# Patient Record
Sex: Female | Born: 1975
Health system: Southern US, Community
[De-identification: ages and names within clinical notes are randomized; demographics above are authoritative.]

## PROBLEM LIST (undated history)

## (undated) DIAGNOSIS — Z9889 Other specified postprocedural states: Secondary | ICD-10-CM

## (undated) DIAGNOSIS — Z86718 Personal history of other venous thrombosis and embolism: Secondary | ICD-10-CM

## (undated) DIAGNOSIS — R011 Cardiac murmur, unspecified: Secondary | ICD-10-CM

## (undated) DIAGNOSIS — R112 Nausea with vomiting, unspecified: Secondary | ICD-10-CM

## (undated) HISTORY — DX: Personal history of other venous thrombosis and embolism: Z86.718

## (undated) HISTORY — PX: TYMPANOPLASTY: SHX33

---

## 2003-06-03 ENCOUNTER — Inpatient Hospital Stay (HOSPITAL_COMMUNITY): Admission: AD | Admit: 2003-06-03 | Discharge: 2003-06-03 | Payer: Self-pay | Admitting: Obstetrics and Gynecology

## 2003-06-11 ENCOUNTER — Inpatient Hospital Stay (HOSPITAL_COMMUNITY): Admission: AD | Admit: 2003-06-11 | Discharge: 2003-06-24 | Payer: Self-pay | Admitting: Obstetrics and Gynecology

## 2003-06-26 ENCOUNTER — Inpatient Hospital Stay (HOSPITAL_COMMUNITY): Admission: AD | Admit: 2003-06-26 | Discharge: 2003-06-26 | Payer: Self-pay | Admitting: Obstetrics and Gynecology

## 2003-07-08 ENCOUNTER — Inpatient Hospital Stay (HOSPITAL_COMMUNITY): Admission: RE | Admit: 2003-07-08 | Discharge: 2003-07-08 | Payer: Self-pay | Admitting: Obstetrics and Gynecology

## 2003-07-14 ENCOUNTER — Observation Stay (HOSPITAL_COMMUNITY): Admission: AD | Admit: 2003-07-14 | Discharge: 2003-07-15 | Payer: Self-pay | Admitting: Obstetrics and Gynecology

## 2003-07-30 ENCOUNTER — Inpatient Hospital Stay (HOSPITAL_COMMUNITY): Admission: AD | Admit: 2003-07-30 | Discharge: 2003-07-30 | Payer: Self-pay | Admitting: Obstetrics and Gynecology

## 2003-08-07 ENCOUNTER — Inpatient Hospital Stay (HOSPITAL_COMMUNITY): Admission: RE | Admit: 2003-08-07 | Discharge: 2003-08-07 | Payer: Self-pay | Admitting: Obstetrics and Gynecology

## 2003-08-21 ENCOUNTER — Inpatient Hospital Stay (HOSPITAL_COMMUNITY): Admission: AD | Admit: 2003-08-21 | Discharge: 2003-08-26 | Payer: Self-pay | Admitting: Obstetrics and Gynecology

## 2003-08-22 ENCOUNTER — Encounter (INDEPENDENT_AMBULATORY_CARE_PROVIDER_SITE_OTHER): Payer: Self-pay | Admitting: *Deleted

## 2003-08-27 ENCOUNTER — Encounter: Admission: RE | Admit: 2003-08-27 | Discharge: 2003-09-26 | Payer: Self-pay | Admitting: Obstetrics and Gynecology

## 2003-09-27 ENCOUNTER — Encounter: Admission: RE | Admit: 2003-09-27 | Discharge: 2003-10-27 | Payer: Self-pay | Admitting: Obstetrics and Gynecology

## 2003-10-01 ENCOUNTER — Other Ambulatory Visit: Admission: RE | Admit: 2003-10-01 | Discharge: 2003-10-01 | Payer: Self-pay | Admitting: Obstetrics and Gynecology

## 2004-11-11 ENCOUNTER — Other Ambulatory Visit: Admission: RE | Admit: 2004-11-11 | Discharge: 2004-11-11 | Payer: Self-pay | Admitting: Obstetrics and Gynecology

## 2006-01-31 HISTORY — PX: CHOLECYSTECTOMY: SHX55

## 2006-02-12 IMAGING — US US OB COMP +14 WK
1 series · 17 of 28 positions shown · non-contrast
Comparison: none

CLINICAL DATA: Bleeding.  History of low-lying placenta.  EDC 09/29/03 (23 weeks 1 day).  G1 P0.

[Series 1: us ob comp +14 wk · 17 of 62 slices shown]
[im 1/62]
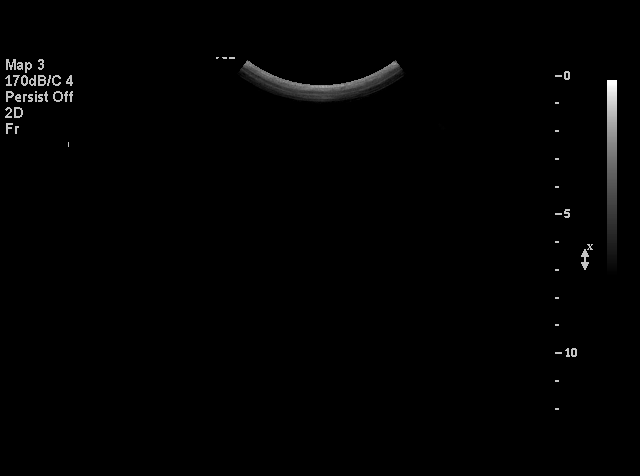
[im 5/62]
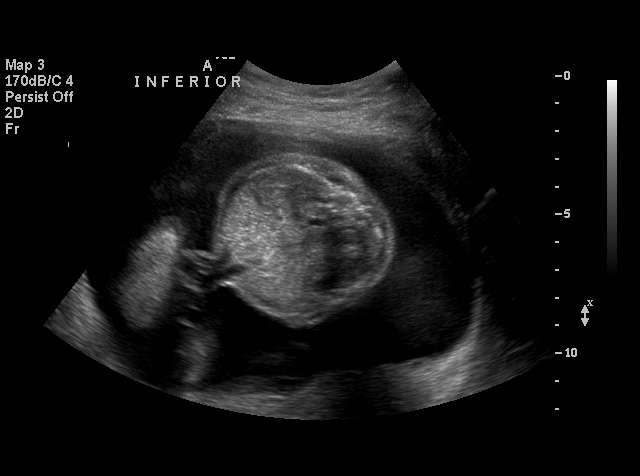
[im 10/62]
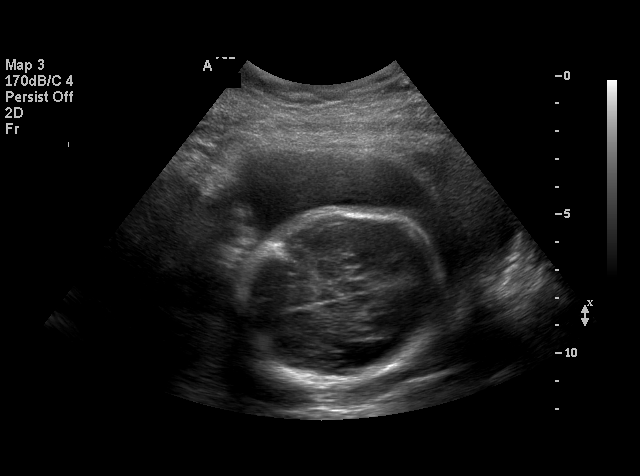
[im 12/62]
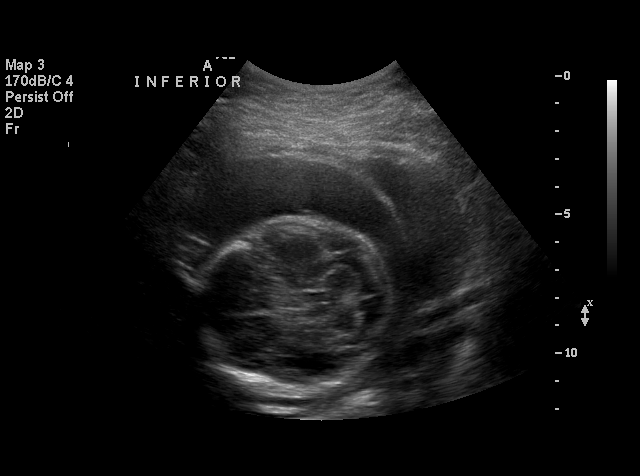
[im 16/62]
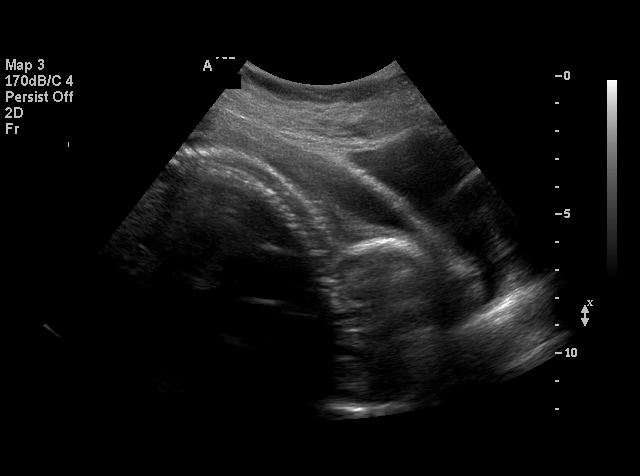
[im 21/62]
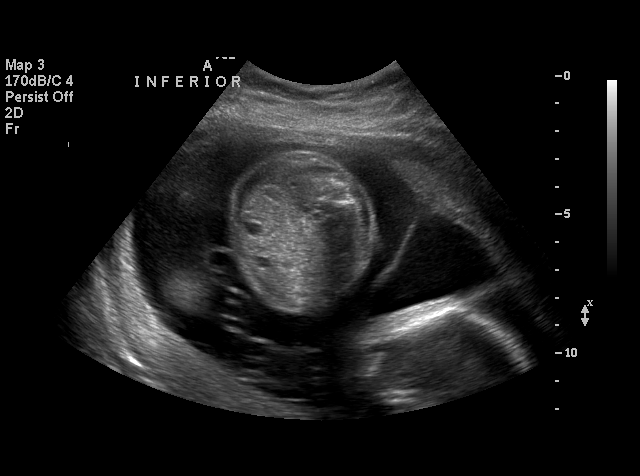
[im 23/62]
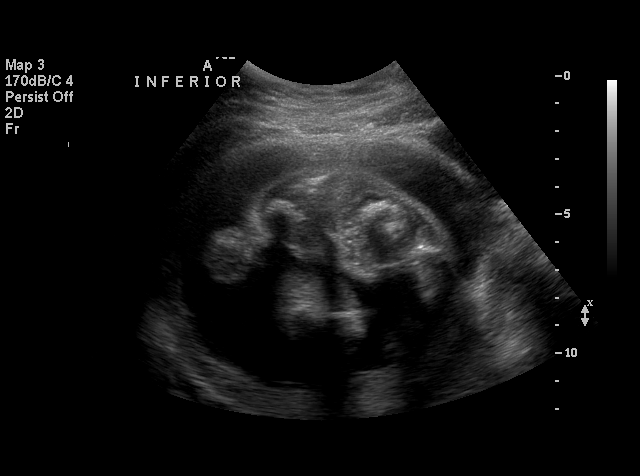
[im 28/62]
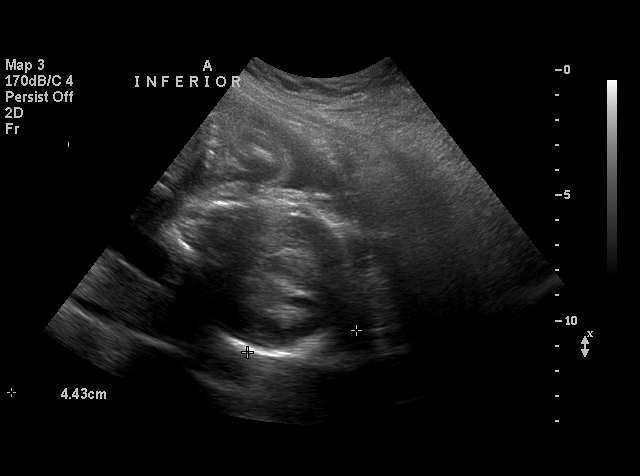
[im 32/62]
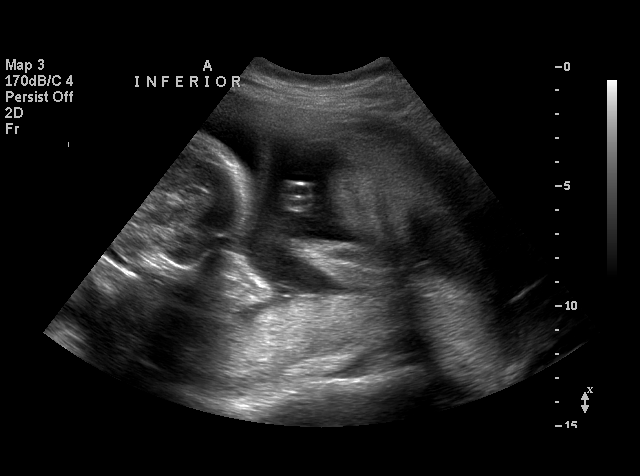
[im 34/62]
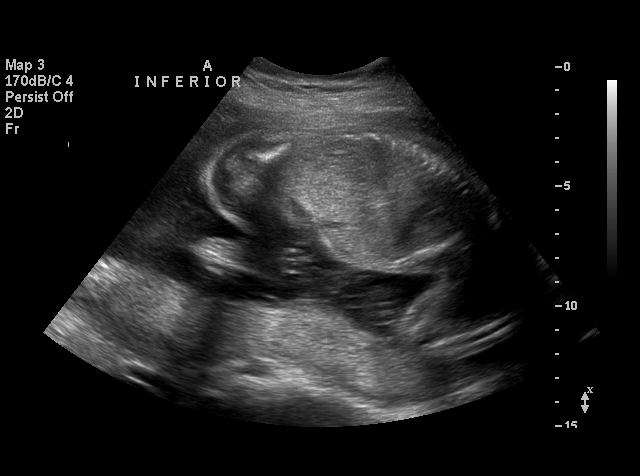
[im 39/62]
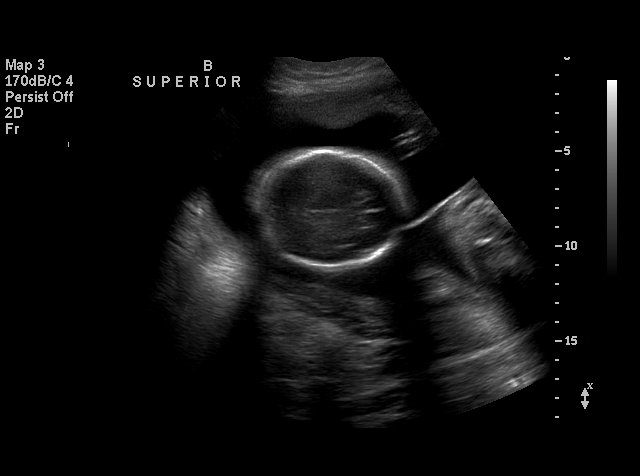
[im 41/62]
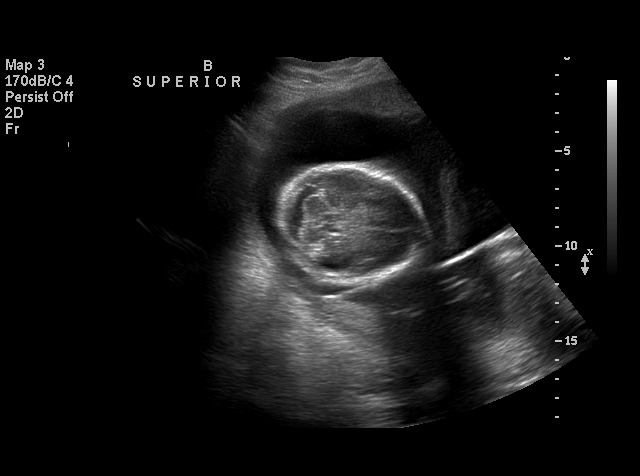
[im 46/62]
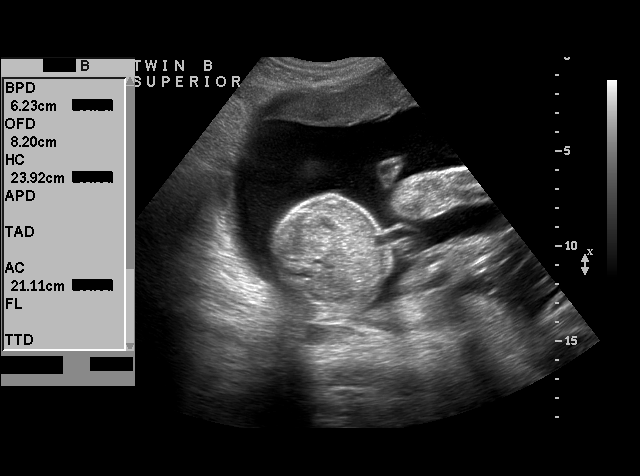
[im 50/62]
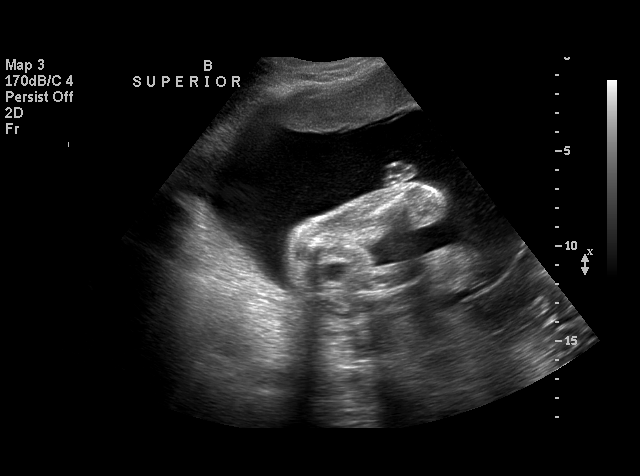
[im 52/62]
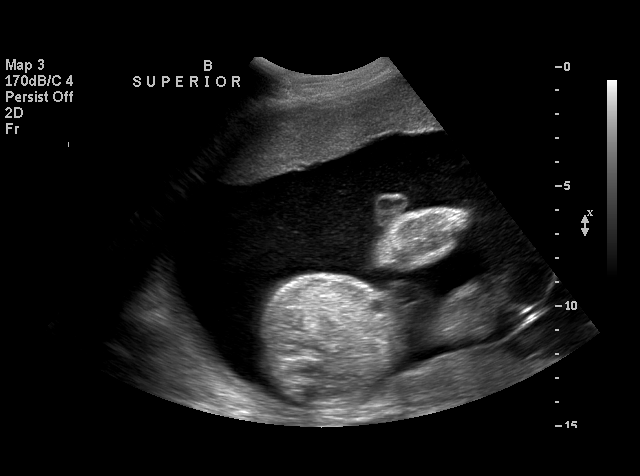
[im 57/62]
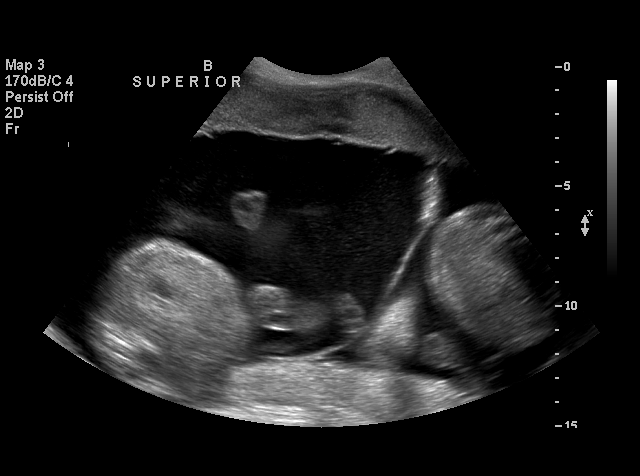
[im 62/62]
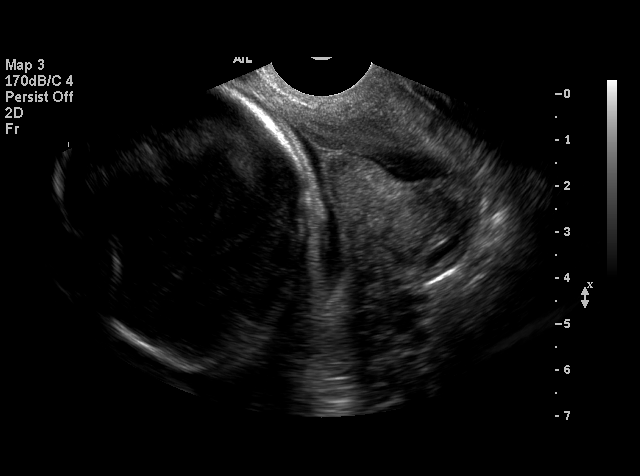

[17 of 28 positions shown; findings below may reference images not displayed]

TWIN OBSTETRICAL ULTRASOUND WITH TRANSVAGINAL
A living dichorionic/diamniotic twin gestation is present.  A thick separating membrane is seen.

TWIN A:
Heart Rate:  146
Movement:  Yes
Breathing:  No
Presentation:  Cephalic inferior
Placental Location:  Posterior
Grade:  I
Previa:  No
Comment:  No site of abruption is identified.
Amniotic Fluid (Subjective):  Normal
Amniotic Fluid (Objective):   4.9 cm Vertical pocket 

FETAL BIOMETRY (TWIN A)
BPD:  6.1 cm  24 w 6 d
HC:  21.5 cm  23 w 5 d
AC:  19.9 cm  24 w 5 d
FL:  4.2 cm  23 w 5 d

Mean GA:  24 w 2 d
Assigned GA:               23 w 1 d

FETAL ANATOMY (TWIN A)
Lateral Ventricles:  Visualized 
Thalami/CSP:  Visualized 
Posterior Fossa:  Visualized 
Nuchal Region:  N/A
Spine:  Visualized 
4 Chamber Heart on L:  Visualized 
Stomach on Left:  Visualized 
3 Vessel Cord:  Not visualized 
Cord Insertion Site:  Visualized 
Kidneys:  Visualized 
Bladder:  Visualized 
Extremities:  Not visualized 

TWIN B:
Heart Rate:  154
Movement:  Yes
Breathing:  No
Presentation:  Transverse with head to maternal right
Placental Location:  Anterior
Grade:  I
Previa:  No
Comment:  No site of abruption identified.
Amniotic Fluid (Subjective):  Normal
Amniotic Fluid (Objective):   6.6 cm Vertical pocket 

FETAL BIOMETRY (TWIN B)
BPD:  6.2 cm     25 w 1 d
HC:  23.5 cm   25 w 4 d
AC:  20.8 cm   25 w 3 d
FL:  4.5 cm   25 w 0 d

Mean GA:  25 w 2 d
Assigned GA:              23 w 1 d

FETAL ANATOMY (TWIN B)
Lateral Ventricles:  Visualized 
Thalami/CSP:  Visualized 
Posterior Fossa:  Visualized 
Nuchal Region:  N/A
Spine:  Not visualized 
4 Chamber Heart on L:  Visualized 
Stomach on Left:  Visualized 
3 Vessel Cord:  Visualized 
Cord Insertion Site:  Visualized 
Kidneys:  Visualized 
Bladder:  Visualized 
Extremities:  Not visualized 

MATERNAL FINDINGS
Cervix:  3.4 cm Transvaginally
IMPRESSION: Living dichorionic/diamniotic twin pregnancy of 23 weeks 1 day gestational age.  Twin A measures 24 weeks 2 days and twin B measures 25 weeks 2 days. Growth is appropriate.
There is no evidence of placenta previa.  No site of abruption is identified.  
Normal fluid in each sac.
Limited anatomic survey.
Normal cervical length of 3.4 cm on transvaginal scanning.  
This study was performed as an on-call procedure and was reviewed by Dr. Azeem Galvan at the time it was performed.

## 2007-01-10 ENCOUNTER — Emergency Department (HOSPITAL_COMMUNITY): Admission: EM | Admit: 2007-01-10 | Discharge: 2007-01-10 | Payer: Self-pay | Admitting: Emergency Medicine

## 2007-06-29 ENCOUNTER — Ambulatory Visit (HOSPITAL_COMMUNITY): Admission: RE | Admit: 2007-06-29 | Discharge: 2007-06-29 | Payer: Self-pay | Admitting: General Surgery

## 2007-06-29 ENCOUNTER — Encounter (HOSPITAL_BASED_OUTPATIENT_CLINIC_OR_DEPARTMENT_OTHER): Payer: Self-pay | Admitting: General Surgery

## 2010-02-21 ENCOUNTER — Encounter: Payer: Self-pay | Admitting: Obstetrics and Gynecology

## 2010-04-19 LAB — HIV ANTIBODY (ROUTINE TESTING W REFLEX): HIV: NONREACTIVE

## 2010-04-19 LAB — ABO/RH: RH Type: NEGATIVE

## 2010-04-19 LAB — RPR: RPR: NONREACTIVE

## 2010-06-15 NOTE — Op Note (Signed)
[name], [name]                  ACCOUNT NO.:  [id number]   MEDICAL RECORD NO.:  [id number]          PATIENT TYPE:  AMB   LOCATION:  DAY                          FACILITY:  [facility]   PHYSICIAN:  Leonie Man, M.D.   DATE OF BIRTH:  1976-01-14   DATE OF PROCEDURE:  06/29/2007  DATE OF DISCHARGE:                               OPERATIVE REPORT   PREOPERATIVE DIAGNOSIS:  Chronic calculous cholecystitis.   POSTOPERATIVE DIAGNOSIS:  Chronic calculous cholecystitis.   PROCEDURE:  Laparoscopic cholecystectomy with intraoperative  cholangiogram.   SURGEON:  Leonie Man, M.D.   ASSISTANT:  Angelia Mould. Derrell Lolling, M.D.   ANESTHESIA:  General.   SPECIMENS:  Gallbladder with stones to the laboratory.   ESTIMATED BLOOD LOSS:  Minimal.   COMPLICATIONS:  None.   The patient returned to the PACU in excellent condition.   Ms. Kaylla Cobos is a 35 year old patient who, in the late part of 2008,  began having recurrent attacks of epigastric pain.  She was subsequently  evaluated with abdominal ultrasound and noted to have multiple small  stones within the gallbladder.  She comes to the operating room now  after the risks and potential benefits of surgery had been fully  discussed.  All questions answered and consent obtained.   PROCEDURE:  Following the induction of satisfactory general anesthesia,  the patient positioned supinely.  The abdomen is prepped and draped to  be included in a sterile operative field.  Positive identification of  the patient is carried out, identifying her as Veronica Wu and the  operation to be done as laparoscopic cholecystectomy with cholangiogram.   Open laparoscopy is created at the umbilicus with insertion of a Hassan  type cannula and the abdomen is insufflated with carbon dioxide to 14  mmHg pressure.  Camera is inserted and visual exploration of the abdomen  carried out.  Her liver edges were sharp, liver surfaces smooth, the  gallbladder was mildly  chronically scarred, the anterior gastric wall  and duodenal sweep appeared to be normal.  None of the large and small  intestines visualized appeared to be abnormal.  Pelvic organs were not  fully visualized.  Under direct vision, epigastric and lateral ports  were placed and the gallbladder is grasped and retracted cephalad.  Dissection in the ampullary region of the gallbladder were isolation  with the cystic artery and cystic duct. Cystic artery was traced up to  its entry into the gallbladder wall and cystic duct was traced down to  the cystic duct common duct junction and out through the cystic duct and  gallbladder junction and the cystic duct was clipped proximally and  opened and the cystic artery was doubly clipped.  A Cook catheter was  then transcutaneously into the abdomen into the cystic duct through  which one half-strength Hypaque was injected under fluoroscopic control  with the resulting cholangiogram showing prompt flow of contrast into  the duodenum with no filling defects within the common bile duct or any  of the hepatic radicles.  The cystic duct catheter was then removed and  cystic duct  was doubly clipped and transected.  The cystic artery,  having been triply clipped, was transected in the gallbladder was then  dissected free from the liver bed using electrocautery and maintaining  hemostasis throughout the entire course of the dissection.  At the end  of the dissection, all areas of the liver bed were then checked for  hemostasis.  Additional bleeding points treated with electrocautery.  The right upper quadrant was then thoroughly irrigated with multiple  aliquots of normal saline and aspirated.  The gallbladder was placed in  an Endopouch and retrieved through the umbilical port without  difficulty.  Sponge, instruments and sharp counts were doubly verified.  The pneumoperitoneum allowed to deflate and the wounds closed in layers  as follows after all trocars  had been removed under direct vision.  The  umbilical wound closed in two layers with 0 Vicryl and 4-0 Monocryl.  Epigastric and flank incisions were closed with 4-0 Monocryl sutures.  All wounds were then reinforced with Dermabond.  The anesthetic was  reversed and the patient removed from the operating room to the recovery  room in stable condition.  She tolerated the procedure well.      Leonie Man, M.D.  Electronically Signed     PB/MEDQ  D:  06/29/2007  T:  06/29/2007  Job:  [id number]

## 2010-06-18 NOTE — H&P (Signed)
Veronica [name], Veronica [name] NO.:  [id number]   MEDICAL RECORD NO.:  [id number]                   PATIENT TYPE:  MAT   LOCATION:  MATC                                 [facility]ACILITY:  [facility]   PHYSICIAN:  Cam Hai, C.N.M.               DATE OF BIRTH:  15-Sep-1975   DATE OF ADMISSION:  07/14/2003  DATE OF DISCHARGE:                                HISTORY & PHYSICAL   Veronica [name] is a 35 year old married white female, primigravida, at 29-1/7 weeks  with a twin gestation who presents with ten uterine contractions for two  consecutive hours despite her terbutaline pump boluses. She reports the  quality of uterine contractions being slightly more uncomfortable then  previously, but mainly feeling a tightening without pain. She denies  headache, nausea, vomiting, and visual disturbances. No leaking and no  bleeding. She has had a negative fetal fibronectin on June 2nd and cervix at  that time was closed and 2 cm long. Ultrasound from that same date showed  appropriate growth with estimated fetal weight around the 90th percentile.  She has been followed by the [facility] service following a  discharge from the hospital approximately three to four weeks ago where she  was admitted for magnesium sulfate therapy for preterm labor. She does home  monitoring b.i.d. with the [facility] service as well as receiving terbutaline  pump boluses. Her prenatal labs were collected on March 04, 2003. Her  hemoglobin was 13.6, hematocrit 40.4, platelet count 266,000. Blood type A  positive, antibody negative, RPR nonreactive, rubella immune. Hepatitis B  surface antigen negative. HIV nonreactive. Pap smear within normal limits.  Cystic fibrosis negative. Parvo titers were negative. Varicella immune.   HISTORY OF PRESENT PREGNANCY:  The patient presented for care at [facility] on March 04, 2003, at [redacted] weeks gestation. Pregnancy  ultrasonography performed from that date showed  a twin gestation with growth  consistent with previous ultrasound.  Pregnancy ultrasonography at [redacted] weeks  gestation showed low lying placenta with twin A and growth consistent with  previous ultrasounds. Subsequent ultrasonography at [redacted] weeks gestation  showed normal placenta with normal fluid and growth. At approximately [redacted]  weeks gestation the patient was admitted to [facility] for magnesium  sulfate therapy for preterm labor. Her cervix remained closed, but she was  having frequent contractions. Her fetal fibronectin was negative from that  time and subsequent to her discharge from [facility] she was followed  at home by the [facility] service until the incidents of this  evening brought into the [facility].   OBSTETRIC HISTORY:  She is a primigravida.   PAST MEDICAL HISTORY:  She has medication allergies to PENICILLIN,  ERYTHROMYCIN, SULFA, FLOXIN, and CLINDAMYCIN. She has used birth control in  the past and stopped in April 2004. She had an abnormal Pap smear in 2000  with colposcopies  and normal Pap smears since. She reports having had the  usual childhood illnesses. She has a history of mitral valve prolapse with  no prophylactic antibiotics. She had frequent urinary tract infections in  college.   PAST SURGICAL HISTORY:  Remarkable for a tympanoplasty times two and tubes  in her ears at the age of 19.   FAMILY MEDICAL HISTORY:  Remarkable for father with MI and bypass surgery  times two. Paternal grandfather and maternal grandmother with MI. Maternal  grandmother with chronic hypertension as well as her father. Paternal  grandfather with diabetes. Paternal grandmother with Alzheimer disease.   GENETIC HISTORY:  Negative.   SOCIAL HISTORY:  The patient is married to the father of the baby. He is  involved and supportive. His name is Trey Paula. The patient and the father of the  baby both have four years of college education. The patient is employed as a   Designer, jewellery at [employer]. The father of the baby works in  Airline [employer]. They deny any alcohol, tobacco, or illicit drug use with her  pregnancy.   OBJECTIVE DATA:  VITAL SIGNS: Blood pressure is 134/86, pulse 109. Her other  vital signs are stable. She is afebrile.  HEENT: Grossly within normal limits.  CHEST: Clear to auscultation.  HEART: Regular rate and rhythm.  ABDOMEN: Gravid in contour. Fundal height extending approximately 34 cm  above the pubic symphysis. Fetal heart rate is reactive and reassuring times  two. Uterine contractions every four to six minutes, mild. Cervix is closed,  50%, and -1.  EXTREMITIES: Within normal limits.  GU: Clean catch urinalysis is negative.   ASSESSMENT:  1. Intrauterine pregnancy at 29-1/7 weeks with twin gestation.  2. Preterm contractions without cervical change.   PLAN:  Admit to antenatal unit for 23 hour observation per Dr. Osborn Coho. Options were given to patient and spouse observing with terbutaline  pump use overnight or beginning on magnesium sulfate therapy. The patient  requests to use a terbutaline pump for now and advance to magnesium sulfate  later if needed.                                               Cam Hai, C.N.M.    KS/MEDQ  D:  07/15/2003  T:  07/15/2003  Job:  [id number]

## 2010-06-18 NOTE — Discharge Summary (Signed)
Veronica [name], Veronica [name] NO.:  [id number]   MEDICAL RECORD NO.:  [id number]                   PATIENT TYPE:  INP   LOCATION:  9153                                 FACILITY:  [facility]   PHYSICIAN:  Osborn Coho, M.D.                DATE OF BIRTH:  1975/10/17   DATE OF ADMISSION:  07/14/2003  DATE OF DISCHARGE:  07/15/2003                                 DISCHARGE SUMMARY   ADMITTING DIAGNOSES:  1. Intrauterine pregnancy with twins at 29 weeks.  2. Preterm labor without cervical change.   DISCHARGE DIAGNOSES:  1. Intrauterine twin pregnancy at 10 and 1/7ths weeks.  2. Stable preterm labor on the terbutaline pump.  3. Slight elevation of 1-hour GTT.   PROCEDURES:  None.   HOSPITAL COURSE:  [name] is a 35 year old gravida 1, para 0, at 71 weeks  who was admitted on the evening of July 14, 2003 with 10 contractions over 2  hours, despite terbutaline pump boluses.  Contractions also had been  slightly more intense.  She denied any headache, nausea, vomiting, vaginal  discharge, or leaking.  She had a negative fetal fibronectin on July 03, 2003, and cervix at that time was closed, 2 cm long.  On admission, vital  signs were stable.  Fetal heart rates were reactive and reassuring x2.  Contractions were every 3-7 minutes, irregular.  Cervix was closed, 50%  vertex at a -1.  Clean catch urine was negative.  She was given one dose of  supplemental terbutaline subcutaneously, and was maintained on her  terbutaline pump.  Uterine contractions persisted, although they were mild.  The patient was offered 23-hour observation with a terbutaline pump, or  magnesium sulfate administration.  Patient and husband elected to defer  magnesium sulfate, and to be observed for 23 hours with terbutaline boluses  as previously programmed in the pump.  Through the night, the patient  continued to have contractions anywhere from 5-7 minutes apart, although  they did decrease in  intensity.  By the morning of July 15, 2003, the  patient was doing well.  She had not slept very well, but was more  comfortable and less aware of her contractions.  Fetal heart rate was  reactive with both fetuses.  She did have a 1-hour Glucola with a CBG done  at 1 hour of 144; a serum glucose done at the patient's request soon after  that was 153.  Her cervix remained unchanged.  Dr. Stefano Gaul saw the patient,  and felt that she had received the full benefit of her hospital stay.  By  the time of her discharge, she was contracting less than every 10 minutes.  She also had received a dose of Motrin, which also had made her more  comfortable.  With the unchanged nature of her cervix, and the lessening of  her contractions, the patient was  deemed to have received the full benefit  of her hospital stay, and was discharged home.   DISCHARGE INSTRUCTIONS:  The patient is to remain on bed rest.  She is to  have no physical activity.  She may get up and shower every other day.  She  is to be on pelvic rest.   DISCHARGE MEDICATIONS:  1. Terbutaline pump will be continued at the current basal rate, and boluses     every 3 hours.  2. Ibuprofen 600 mg one p.o. q.6h. p.r.n. will be used by the patient.  3. Prenatal vitamin one p.o. daily.   DISCHARGE FOLLOW UP:  On July 17, 2003 at [facility] with Dr.  Estanislado Pandy for her appointment.  Dr. Estanislado Pandy at that time will review the  patient's status and determine follow up on her Glucola.  The patient is to  call with any increasing contractions or any other problems.     [name], C.N.M.                   Osborn Coho, M.D.    VLL/MEDQ  D:  07/15/2003  T:  07/15/2003  Job:  [id number]

## 2010-06-18 NOTE — Discharge Summary (Signed)
NAMETORREY, HORSEMAN                              ACCOUNT NO.:  [id number]   MEDICAL RECORD NO.:  [id number]                   PATIENT TYPE:  INP   LOCATION:  9130                                 [facility]ACILITY:  [facility]   PHYSICIAN:  Crist Fat. Rivard, M.D.              DATE OF BIRTH:  July 22, 1975   DATE OF ADMISSION:  08/21/2003  DATE OF DISCHARGE:  08/26/2003                                 DISCHARGE SUMMARY   ADMISSION DIAGNOSES:  1. Intrauterine twin pregnancy at 34-/37 weeks.  2. Preterm premature rupture of membranes.   DISCHARGE DIAGNOSES:  1. Intrauterine twin pregnancy at 34-/37 weeks.  2. Preterm premature rupture of membranes.  3. Preeclampsia, severe.  4. Failure to progress.   HOSPITAL PROCEDURES:  1. Ultrasound.  2. Pitocin augmentation of labor.  3. Electronic fetal monitoring.  4. Epidural anesthesia.  5. Primary low transverse cesarean section of viable female infants, twin A     named Jimmey Ralph, weighing 5 pounds 1 ounce, Apgars 7 and 8, twin B named     Fredricka Bonine, weighing 6 pounds 2 ounces, Apgars 6 and 8.   HOSPITAL COURSE:  The patient was admitted with preterm premature rupture of  membranes of clear fluid at 34-3/7 weeks.  Cervix on admission was 1-2 cm,  80% effaced and 0 station.  Ultrasound was done which revealed both twins to  be in the vertex presentation.  Pitocin augmentation was begun.  Blood  pressure was elevated at 139/97 and PIH labs were done revealing elevated  liver function tests and elevated uric acid.  Cervix at that time was 3 cm  and therefore, labor was augmented with Pitocin to enhance labor.  Patient  progressed steadily throughout the day and got to 10 cm.  She pushed for 2  hours with little descent of the head and decision was made at that time to  have a primary low transverse cesarean section as vacuum extractor was  declined by the patient and her husband.  Cesarean section was performed by  Dr. Janine Limbo under epidural anesthesia for  viable female infants,  Jimmey Ralph and Fredricka Bonine, who were taken to the NICU and full-term nursery,  respectively.  On postop day #0, liver function tests increased more so.  Magnesium sulfate administration was continued and later discontinued.  The  patient was monitored in the AICU.  Diuresis began.  On postop day #1, blood  pressures were 105-122/50-74.  The patient was afebrile.  Urine output was  2200 in the first 4 hours of the day.  Liver function tests improved.  The  patient began breast-feeding and breast-pumping.  On postop day #2, the  patient was improving, blood pressures were within normal limits, liver  function tests continued to decline.  On postop day #3, the patient  continued to do well, with Jimmey Ralph in the NICU and Fredricka Bonine in the central  nursery.  Breast-feeding proceeded.  The patient continues to diurese.  On  postop day #4, blood pressure was 126/80, diuresis was continuing, incision  was clean, dry and intact, abdomen was soft and appropriately tender, lungs  were clear, chest was clear and heart rate was regular rate and rhythm,  lochia was small, extremities within normal limits and she was deemed to  have received the full benefit of her hospital stay and was discharged home.   DISCHARGE MEDICATIONS:  1. Motrin 600 mg p.o. q.6 h. p.r.n.  2. Tylox 1-2 p.o. q.4 h. p.r.n.  3. Iron sulfate once per day.   DISCHARGE LABORATORIES:  On August 24, 2003, AST 43, ALT 129, bilirubin 0.1,  uric acid 9.4; hemoglobin 7.2 and no later labs are available.   DISCHARGE INSTRUCTIONS:  Per [facility] handout.   DISCHARGE FOLLOWUP:  Discharge followup in 6 weeks or p.r.n.     Marie L. Williams, C.N.M.                 Crist Fat Rivard, M.D.    MLW/MEDQ  D:  08/26/2003  T:  08/26/2003  Job:  [id number]

## 2010-06-18 NOTE — Discharge Summary (Signed)
Veronica [name], Veronica [name]                              ACCOUNT NO.:  [id number]   MEDICAL RECORD NO.:  [id number]                   PATIENT TYPE:  INP   LOCATION:  9159                                 [facility]   PHYSICIAN:  Crist Fat. Rivard, M.D.              DATE OF BIRTH:  05-Aug-1975   DATE OF ADMISSION:  06/11/2003  DATE OF DISCHARGE:                                 DISCHARGE SUMMARY   ADMISSION DIAGNOSES:  1. Intrauterine pregnancy at 24 weeks and two-sevenths with twins.  2. Preterm labor.  3. Positive fetal fibronectin.  4. Positive group B streptococcus.   DISCHARGE DIAGNOSES:  1. Intrauterine pregnancy with twins at 26 weeks and one-seventh.  2. Stable preterm labor on terbutaline pump.  3. Negative fetal fibronectin on Jun 22, 2003.  4. Status post betamethasone series.   PROCEDURES THIS ADMISSION:  None.   HOSPITAL COURSE:  Veronica [name] is a 35 year old married white female gravida 1  para 0 with twin gestation at 68 and two-sevenths weeks on admission who is  admitted for tocolysis requiring magnesium sulfate therapy secondary to  preterm uterine contractions, mild cervical change, and positive fetal  fibronectin.  She was started on magnesium sulfate therapy on Jun 11, 2003  and they attempted to wean her off on Jun 13, 2003 starting her on p.o.  terbutaline and Motrin.  However, she developed shortness of breath and  chest tightness with the p.o. terbutaline and was restarted on magnesium  sulfate therapy on Jun 14, 2003.  She also was tried on Procardia but  continued to breakthrough on that and required the magnesium sulfate.  The  plan then was to continue on magnesium sulfate until at least [redacted] weeks  gestation whereupon she was weaned to a terbutaline pump and has now been on  the terbutaline pump overnight without increase in uterine contractions.  Her cervix is stable at 2-2.5 cm long, negative fetal fibronectin now, and  mild irregular uterine contractions.  She is  now deemed ready for discharge  today.   DISCHARGE INSTRUCTIONS:  Call for signs or symptoms of preterm labor,  decreased fetal movement, and she is to continue on complete bedrest at  home.   DISCHARGE MEDICATIONS:  1. Terbutaline pump managed by [facility].  2. Delalutin weekly on Tuesdays, also given by [facility].   DISCHARGE LABORATORY DATA:  Her hemoglobin was 9.9 on May 15, wbc count of  12.6, platelets of 254.  Fetal fibronectin was negative on May 22.   DISCHARGE FOLLOW-UP:  With Dr. Estanislado Pandy on Jul 01, 2003 at 11:30 a.m. with Dr.  Estanislado Pandy or p.r.n.   DISCHARGE STATUS:  Well and stable.     Concha Pyo. Duplantis, C.N.M.              Crist Fat Rivard, M.D.    SJD/MEDQ  D:  06/24/2003  T:  06/24/2003  Job:  [id number] 

## 2010-06-18 NOTE — H&P (Signed)
NAMECARSEN, MACHI                              ACCOUNT NO.:  [id number]   MEDICAL RECORD NO.:  [id number]                   PATIENT TYPE:  INP   LOCATION:  9198                                 FACILITY:  [facility]   PHYSICIAN:  Crist Fat. Rivard, M.D.              DATE OF BIRTH:  1975-04-06   DATE OF ADMISSION:  06/11/2003  DATE OF DISCHARGE:                                HISTORY & PHYSICAL   HISTORY OF PRESENT ILLNESS:  The patient is a 35 year old, gravida 1, para  0, at 24-2/7 weeks, EDD 09/29/03 by early ultrasound.  The patient has a twin  gestation and was seen on 5/3 for unexplained second trimester bleeding.  Ultrasound at that time was normal.  The patient did have some contractions  during that visit and they subsided with terbutaline subcu x2.  The patient  went home on bed rest.  She followed up in the office of [facility] yesterday on  06/10/03.  She was not having any complaints.  No contractions.  No bleeding  since 06/06/03.  She had a fetal fibronectin done in the office yesterday  which was called in this morning and was positive.  The patient also was  noted to be positive for group B Strep.  She therefore presented to  maternity admissions for betamethasone and monitoring.  Initially, the  baby's heart rate x2 was 140-150 with good variability.  However, the toco  showed contractions every 3-4 minutes which continued despite subcu  terbutaline.  The patient's cervix when examined was long and closed.  However, in light of early gestation and twin gestation with positive fetal  fibronectin, the patient is admitted for tocolysis with magnesium sulfate.  This patient's pregnancy has been followed by the M.D. service at [facility] with  Dr. Silverio Lay as primary M.D.   This pregnancy is remarkable for:  1. Twin gestation.  2. History of mitral  valve prolapse.  No prophylactic antibiotics used.  3. Mild 30% hearing loss  in the right ear.  4. History of urinary tract infections.  4.  Medication  allergies to penicillin, erythromycin, sulfa, Floxin, and clindamycin.  5.  Group B Strep positive.  6. Positive fetal fibronectin.   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  She was initially  evaluated for this pregnancy on 03/04/03 at 10 weeks' gestation.  A followup  ultrasound on that day confirmed twin gestation.  The patient's pregnancy to  this point has been essentially unremarkable until 06/03/03.  She has been  normotensive, although on 4/29 blood pressure became elevated slightly to  142/84 from initial blood pressures of 100/60s.  She has not had any  proteinuria and ultrasound evaluation of the pregnancy to this point  including ultrasound for anatomy at 19 weeks with Dr. Sherrie George have all been  normal.   This patient's prenatal lab work on  03/04/03:  Hemoglobin and hematocrit 13.6  and 40.4, platelets 266,000, blood type and RAK positive, antibody screen  negative, VDRL nonreactive, rubella immune.  Hepatitis B surface antigen  negative.  HIV nonreactive.  Pap smear within normal limits.  GC and  chlamydia declined.  CF testing negative.  Parvovirus titers negative and  negative.  Varicella immune.  On 06/10/03, the patient had a positive fetal  fibronectin and positive group B Strep cultures.   OBSTETRICAL HISTORY:  The present pregnancy with a twin gestation.   PAST MEDICAL HISTORY:  Abnormal Pap smear in the year 2000.  The patient had  colposcopy x2.  All Paps have been within normal limits since that time.  The patient has a history of mitral valve prolapse with no prophylactic  antibiotics necessary.  She has a history of frequent UTIs.  The patient had  tympanoplasty x2 at age 33 and 6 and tubes in her ears at age 59.  She has a  30% hearing loss in the right ear.   FAMILY HISTORY:  The patient's father with a history of MI and bypass  surgery x2.  Paternal grandfather and maternal grandmother with MI.  The  patient's maternal grandmother and  father have chronic hypertension.  Paternal grandfather has a history of diabetes which is diet controlled on  p.o. medications.  Paternal grandmother has a history of Alzheimer's  disease.  The patient's father is a smoker.  Genetic history is significant  for twins in patient's great aunts and uncles.   ALLERGIES:  Penicillin, erythromycin, sulfa, Floxin, and clindamycin.   SOCIAL HISTORY:  She denies the use of tobacco, alcohol, or illicit drugs.  The patient is a 35 year old married Caucasian female.  Her husband, Nakayla Rorabaugh, is involved and supportive.  He works in Airline pilot.  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  They are [place] in their faith.   REVIEW OF SYSTEMS:  As described above.  The patient is [redacted] weeks gestation  with twins, positive fetal fibronectin, and contractions which did not  subside following subcu terbutaline.   PHYSICAL EXAMINATION:  Vital signs are stable.  The patient is afebrile.  HEENT:  Unremarkable.  Heart:  Regular rate and rhythm.  Lungs:  Clear.  Abdomen:  Gravid in its contour.  Uterine fundus is noted to extend 34 cm  above the level of the pubic symphysis due to her twin gestation.  Ultrasound examination of the babies is normal.  The patient's cervix is  noted to be long and closed.  Extremities:  1+ edema.  DTRs are brisk, 3/4.  There are no beats of clonus.   ASSESSMENT:  Intrauterine pregnancy at 24-2/7 weeks.  Twin gestation.  Preterm labor.   PLAN:  Admit per Dr. Dois Davenport Rivard.  The patient received betamethasone 12.5  mg IM today and that will be repeated in 24 hours.  The patient is to start  on tocolysis with magnesium sulfate 4 g bolus and then 2 g per hour.  She  will remain on bed rest with the M.D.'s to follow her care.     Rica Koyanagi, C.N.M.               Crist Fat Rivard, M.D.    SDM/MEDQ  D:  06/11/2003  T:  06/11/2003  Job:  [id number]   cc:   Dr. Sherrie George

## 2010-06-18 NOTE — Op Note (Signed)
Veronica [name], [name] NO.:  [id number]   MEDICAL RECORD NO.:  [id number]                   PATIENT TYPE:  INP   LOCATION:  9373                                 [facility]ACILITY:  [facility]   PHYSICIAN:  Janine Limbo, M.D.            DATE OF BIRTH:  1976-01-05   DATE OF PROCEDURE:  08/22/2003  DATE OF DISCHARGE:                                 OPERATIVE REPORT   PREOPERATIVE DIAGNOSES:  1. 34-4/[redacted] weeks gestation.  2. Twin gestations (vertex, transverse).  3. Failure to progress in labor.  4. Pre-eclampsia.   POSTOPERATIVE DIAGNOSES:  1. 34-4/[redacted] weeks gestation.  2. Twin gestations (vertex, transverse).  3. Failure to progress in labor.  4. Pre-eclampsia.   OPERATION PERFORMED:  Primary low transverse cesarean section.   SURGEON:  Janine Limbo, M.D.   ASSISTANT:  Concha Pyo. Duplantis, C.N.M.   ANESTHESIA:  Epidural.   DISPOSITION:  Veronica [name] is a 35 year old female, gravida 1, para 0, who has a  known twin gestation.  Her infants were known to be in a vertex and  transverse presentation.  The patient ruptured her membranes at  approximately 11:30 on August 20, 2003.  She presented to the hospital where  she was noted to be pre-eclamptic.  The patient was started on magnesium and  also started on Pitocin.  She labored slowly but was able to completely  dilate her cervix.  She was taken to the operating room suite at  approximately 12:00 midnight.  The patient was allowed to push.  Because she  had been on bedrest for so long during this pregnancy and presumably because  she was on magnesium, she found it difficult to push.  She also experienced  a great deal of back pain associated with her contractions.  When we were  able obtain adequate anesthesia through her epidural.  She found it even  more difficult to push.  The patient did push for almost one hour.  She was  allowed to rest and then she attempted to push once again.  She tired from  pushing  and she felt that she could no longer push her infant.  She also had  concerns about trying to then deliver a second infant.  The cervix was  completely dilated.  The presenting part was at a +2 station according to my  exam.  Because the patient had difficulty pushing, because the infant was at  a +2 station, and because the infant was 34 weeks and 4 days gestation, I  felt that it was not appropriate to attempt a vacuum extraction delivery.  We discussed options for continued resting and then trying to push again  later.  She declined that option.  We then reviewed cesarean delivery.  We  discussed the risks of but not limited to anesthetic complications,  bleeding, infections, and possible damage to the surrounding organs.  She  and her husband agreed that they were ready to proceed with cesarean  delivery.   FINDINGS:  Twin A was a 5 pound 1 ounce female infant Chief Operating Officer).  He was in an  occiput posterior position.  The Apgars were 7 at one minute and 8 at five  minutes.  Twin B was a 6 pound 2 ounce female infant Fredricka Bonine).  The Apgars  were 6 at one minute and 8 at five minutes.  He was in a vertex presentation  and occiput anterior position.  The uterus, the fallopian tubes, and the  ovaries were normal for the gravid state.   DESCRIPTION OF PROCEDURE:  The patient was taken to the operating room where  she was allowed to push in an attempt to deliver her babies.  Please see the  above paragraph.  After she had decided to proceed with cesarean delivery,  she was placed in a supine position.  The abdomen was prepped with multiple  layers of Betadine.  A Foley catheter had previously been placed.  The  patient was then sterilely draped.  Initially, the epidural was not adequate  for cesarean delivery but the patient was given more medication through the  epidural catheter and the epidural catheter was actually slightly removed  from the back.  She was able to then tolerate her cesarean  delivery.  The  lower abdomen was injected with 10 mL of 0.5% Marcaine with epinephrine.  A  low transverse incision was made and the incision was carried sharply  through the subcutaneous tissue, the fascia, and the anterior peritoneum.  An incision was made in the lower uterine segment and the bladder flap was  developed.  The incision was then extended in a low transverse fashion.  The  first infant was delivered from occiput posterior position.  The mouth and  nose were suctioned.  The remainder of the infant was then delivered.  The  cord was clamped and cut and the infant was handed to Cambria D. Imm, M.D.  the awaiting pediatrician.  The second infant was then delivered from a  occiput anterior presentation.  The amniotic fluid was clear for this  infant.  The mouth and nose of this infant was then suctioned.  The  remainder of the infant was delivered.  The cord was clamped and cut and the  infant was handed again to the pediatric team.  Routine cord blood studies  were obtained.  The placenta was removed and sent to pathology.  The uterine  cavity was cleaned of amniotic fluid, clotted blood and membranes.  The  uterus was noted to be atonic initially, but it firmed with IV Pitocin and  with massage.  The uterine incision was closed using a running locking  suture of 2-0 Vicryl followed by an imbricating suture of 2-0 Vicryl.  The  pelvis was vigorously irrigated.  Hemostasis was adequate throughout.  The  anterior peritoneum and the abdominal musculature were reapproximated in the  midline using 2-0 Vicryl.  The fascia and the subcutaneous area were  irrigated.  Hemostasis was adequate.  The fascia was closed using a running  suture of 0 Vicryl followed by three interrupted sutures of 0 Vicryl. The  subcutaneous layer was closed using a running suture of 0 Vicryl.  The skin  was reapproximated using a subcuticular suture of 4-0 Vicryl.  Sponge, needle and instrument counts were  correct on two occasions.  The estimated  blood loss for this procedure was 1000 mL.  The patient tolerated the  procedure well.  The patient was taken to the recovery room in stable  condition. The infants were taken to the floor term nursery to be watched  closely.  The patient will be maintained on magnesium postpartum.                                               Janine Limbo, M.D.    AVS/MEDQ  D:  08/22/2003  T:  08/22/2003  Job:  [id number]

## 2010-06-18 NOTE — H&P (Signed)
Veronica [name], Veronica [name]                              ACCOUNT NO.:  [id number]   MEDICAL RECORD NO.:  [id number]                   PATIENT TYPE:  INP   LOCATION:  9172                                 FACILITY:  [facility]   PHYSICIAN:  Crist Fat. Rivard, M.D.              DATE OF BIRTH:  September 07, 1975   DATE OF ADMISSION:  08/21/2003  DATE OF DISCHARGE:                                HISTORY & PHYSICAL   HISTORY OF PRESENT ILLNESS:  Mrs. Heishman is a 35 year old married white female  primigravida at 45 and three-sevenths weeks with twin gestation.  She  describes experiencing a gush of fluid from her vagina at 11:30 p.m. tonight  that soaked through her panties and shorts.  She had just finished emptying  her bladder.  She denies bleeding, headache, nausea and vomiting, or visual  disturbances.  Her pregnancy has been followed by the [facility]  OB/GYN M.D. service and has been remarkable for:  1. History of mitral valve prolapse with no medications.  2. Mild hearing loss in the right ear.  3. History of UTIs.  4. Multiple medication allergies.  5. Gestational diabetes mellitus.   Her prenatal labs were collected on March 04, 2003.  Hemoglobin 13.6;  hematocrit 40.4; platelets 266,000.  Blood type A positive, antibody  negative, RPR nonreactive, rubella immune, hepatitis B surface antigen  negative, HIV nonreactive, Pap smear within normal limits, cystic fibrosis  negative, Parvo titer is negative.  Group B strep from Jun 10, 2003 was  negative.  Fetal fibronectin negative from May 10.  Fetal fibronectin  negative from June 7, as well as negative on June 22, and fetal fibronectin  was negative from July 7.  Her 1-hour Glucola was 144.  Her 3-hour glucose  tolerance test was abnormal.   HISTORY OF PRESENT PREGNANCY:  She presented for care at [facility] on  March 04, 2003 at [redacted] weeks gestation.  Pregnancy ultrasonography showed  twin intrauterine pregnancy, EDC consistent with previous  ultrasound.  The  patient declined quad screen.  Pregnancy ultrasonography with Dr. Sherrie George at  [redacted] weeks gestation showed normal anatomy and low-lying placenta.  Ultrasonography at [facility] at [redacted] weeks gestation shows normal fluid  and growth for both twins.  A is vertex and B is transverse.  At [redacted]  weeks gestation the patient was experiencing bedrest for 4 hours per day,  was having an increased tightening of her uterus.  She had had some previous  vaginal bleeding that resolved on May 6.  She had some elevated blood  pressures in the office but home monitoring continued to show normal blood  pressures.  The patient was started on a terbutaline pump.  She was also  hospitalized at approximately [redacted] weeks gestation with preterm contractions.  Matria was monitoring her terbutaline pump.  The patient received  betamethasone at approximately [redacted] weeks gestation.  Her cervix  remained  closed.  She continued to undergo daily monitoring that was being followed  by [facility].  At [redacted] weeks gestation she was treated for a urinary tract  infection.  She was started on blood glucose monitoring secondary to  abnormal 3-hour glucose tolerance test.  Her fetal fibronectins continued to  be negative and her vaginal exams closed with cervix approximately 2.5 cm  long.  She had a repeat dose of betamethasone at [redacted] weeks gestation.  The  possibility of magnesium sulfate was reviewed with the patient at 31 weeks  due to her terbutaline basal rate continuing to be increased.  The patient  declined magnesium sulfate.  At [redacted] weeks gestation estimated fetal weight on  twin A was 75th to 90th percentile with normal fluid, cervix 2.3 cm; on twin  B estimated weight was greater than the 95th percentile with normal fluid.  Twin B remained transverse and twin A vertex.  The patient's blood pressures  from home continued to be in the normal range.  Her fasting and 2-hour CBGs  remained normal.  Her terbutaline pump  was stopped at [redacted] weeks gestation and  her group B strep with sensitivities was reported at that point also on July  18.  Her sugars continued to remain within the normal range as well as her  home blood pressure.  She began increasing her activity at [redacted] weeks  gestation.   OBSTETRICAL HISTORY:  She is a primigravida.   MEDICAL HISTORY:  She has medication allergies to PENICILLIN, ERYTHROMYCIN,  SULFA, FLOXIN, and CLINDAMYCIN.  She stopped Levlite in April 2004.  She has  had history of abnormal Pap smears with colposcopy x2 in 2000 and normal Pap  smears since.  She reports having had the usual childhood illnesses.  She  has history of mitral valve prolapse with no prophylactic antibiotics.  History of frequent urinary tract infections in college.   SOCIAL HISTORY:  Remarkable for tympanoplasty x2 at the ages of 4 and 6,  tubes in her ears at the age of 59.   FAMILY MEDICAL HISTORY:  Remarkable for father with MI and bypass surgery  x2.  Paternal grandfather and maternal grandmother with MIs also.  Maternal  grandmother and father with hypertension.  Paternal grandfather with  diabetes.  Paternal grandmother with Alzheimer's.  The patient has greater  than 30% hearing loss in her right ear.   GENETIC HISTORY:  Negative.   SOCIAL HISTORY:  The patient is married to the father of the baby.  His name  is Trey Paula.  He is involved and supportive.  They are of the [place] faith.  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  Father of the baby is employed full-time in Airline pilot.  They deny any  alcohol, tobacco, or illicit drug use with the pregnancy.   OBJECTIVE DATA:  VITAL SIGNS:  Stable, blood pressure 132/96, she is  afebrile.  HEENT:  Grossly within normal limits.  CHEST:  Clear to auscultation.  HEART:  Regular rate and rhythm.  ABDOMEN:  Gravid in contour with fundal height extending approximately 42 cm above the pubic symphysis.  Fetal heart  rate twin A is reactive and  reassuring.  Twin B baseline is 135 with positive accelerations and no  decelerations.  Uterine contractions every 2-4 minutes, mild.  PELVIC:  Sterile speculum exam shows clear/white vaginal discharge, negative  pooling, negative nitrazine, positive ferning.  Cervix is 1-2 cm, 80%  effaced, vertex 0 station with membrane palpated on exam.  EXTREMITIES:  Show brisk reflexes which are normal for the patient.   Group B strep culture from August 18, 2003 shows no suspicious colonies.   ASSESSMENT:  1. Twin gestation at 37 and three-sevenths weeks.  2. Spontaneous rupture of membranes.   PLAN:  1. Admit to birthing suites per Dr. Estanislado Pandy.  2. OB ultrasound for presentation and growth.  3. Ancef will be started.  4. PIH labs will be drawn.     Cam Hai, C.N.M.                     Crist Fat Rivard, M.D.   KS/MEDQ  D:  08/21/2003  T:  08/21/2003  Job:  [id number]

## 2010-08-29 ENCOUNTER — Inpatient Hospital Stay (HOSPITAL_COMMUNITY)
Admission: AD | Admit: 2010-08-29 | Discharge: 2010-08-29 | Disposition: A | Discharge status: Home / Self Care | Payer: 59 | Source: Ambulatory Visit | Attending: Obstetrics and Gynecology | Admitting: Obstetrics and Gynecology

## 2010-08-29 DIAGNOSIS — Z298 Encounter for other specified prophylactic measures: Secondary | ICD-10-CM | POA: Insufficient documentation

## 2010-08-29 DIAGNOSIS — Z2989 Encounter for other specified prophylactic measures: Secondary | ICD-10-CM | POA: Insufficient documentation

## 2010-08-29 MED ORDER — RHO D IMMUNE GLOBULIN 1500 UNIT/2ML IJ SOLN
300.0000 ug | Freq: Once | INTRAMUSCULAR | Status: AC
  Administered 2010-08-29: 300 ug via INTRAMUSCULAR

## 2010-08-30 LAB — RH IG WORKUP (INCLUDES ABO/RH): Gestational Age(Wks): 29.5

## 2010-09-02 NOTE — ED Provider Notes (Signed)
Pt presented for Rhogam (08/29/10) per protocol

## 2010-09-27 ENCOUNTER — Other Ambulatory Visit: Payer: Self-pay | Admitting: Obstetrics and Gynecology

## 2010-10-25 NOTE — Patient Instructions (Addendum)
   Your procedure is scheduled on: Wednesday  Enter through the Main Entrance of [facility] at: 9am Pick up the phone at the desk and dial [phone] and inform us of your arrival  Please call this number if you have any problems the morning of surgery: [phone]  Remember: Do not eat food after midnight  Do not drink clear liquids after:midnight Take these medicines the morning of surgery with a SIP OF WATER:none  Do not wear jewelry, make-up, or FINGER nail polish Do not wear lotions, powders, or perfumes.  Do not shave 48 hours prior to surgery. Do not bring valuables to the hospital. Leave suitcase in the car. After Surgery it may be brought to your room. For patients being admitted to the hospital, checkout time is 11:00am the day of discharge.     Remember to use your hibiclens as instructed.Please shower with 1/2 bottle the evening before your surgery and the other 1/2 bottle the morning of surgery.

## 2010-10-26 ENCOUNTER — Encounter (HOSPITAL_COMMUNITY): Payer: Self-pay

## 2010-10-27 LAB — COMPREHENSIVE METABOLIC PANEL
BUN: 19
CO2: 24
Calcium: 8.9
Creatinine, Ser: 0.87
GFR calc non Af Amer: >60
Glucose, Bld: 94
Total Protein: 6.1

## 2010-10-27 LAB — CBC
HCT: 39.2
Hemoglobin: 13.7
MCHC: 34.9
MCV: 86.8
RBC: 4.52
RDW: 13.1

## 2010-10-27 LAB — DIFFERENTIAL
Lymphs Abs: 1.9
Monocytes Relative: 7
Neutro Abs: 3.5
Neutrophils Relative %: 58

## 2010-10-28 ENCOUNTER — Encounter (HOSPITAL_COMMUNITY)
Admission: RE | Admit: 2010-10-28 | Discharge: 2010-10-28 | Disposition: A | Discharge status: Home / Self Care | Payer: 59 | Source: Ambulatory Visit | Attending: Obstetrics and Gynecology | Admitting: Obstetrics and Gynecology

## 2010-10-28 ENCOUNTER — Encounter (HOSPITAL_COMMUNITY): Payer: Self-pay

## 2010-10-28 HISTORY — DX: Nausea with vomiting, unspecified: R11.2

## 2010-10-28 HISTORY — DX: Cardiac murmur, unspecified: R01.1

## 2010-10-28 HISTORY — DX: Other specified postprocedural states: Z98.890

## 2010-10-28 LAB — CBC
HCT: 40 % (ref 36.0–46.0)
Hemoglobin: 13.4 g/dL (ref 12.0–15.0)
MCHC: 33.5 g/dL (ref 30.0–36.0)
RDW: 13.7 % (ref 11.5–15.5)
WBC: 10.1 10*3/uL (ref 4.0–10.5)

## 2010-10-28 LAB — SURGICAL PCR SCREEN
MRSA, PCR: NEGATIVE
Staphylococcus aureus: NEGATIVE

## 2010-10-28 LAB — DIFFERENTIAL
Basophils Absolute: 0 10*3/uL (ref 0.0–0.1)
Basophils Relative: 0 % (ref 0–1)
Lymphocytes Relative: 24 % (ref 12–46)
Monocytes Absolute: 1 10*3/uL (ref 0.1–1.0)
Neutro Abs: 6.6 10*3/uL (ref 1.7–7.7)
Neutrophils Relative %: 66 % (ref 43–77)

## 2010-11-02 MED ORDER — GENTAMICIN IN SALINE 1-0.9 MG/ML-% IV SOLN
100.0000 mg | INTRAVENOUS | Status: AC
  Filled 2010-11-02: qty 100

## 2010-11-02 MED ORDER — METRONIDAZOLE IN NACL 5-0.79 MG/ML-% IV SOLN
500.0000 mg | INTRAVENOUS | Status: AC
  Filled 2010-11-02: qty 100

## 2010-11-03 ENCOUNTER — Other Ambulatory Visit: Payer: Self-pay | Admitting: Obstetrics and Gynecology

## 2010-11-03 ENCOUNTER — Encounter (HOSPITAL_COMMUNITY): Payer: Self-pay | Admitting: *Deleted

## 2010-11-03 ENCOUNTER — Encounter (HOSPITAL_COMMUNITY): Payer: Self-pay | Admitting: Neonatology

## 2010-11-03 ENCOUNTER — Encounter (HOSPITAL_COMMUNITY): Payer: Self-pay | Admitting: Anesthesiology

## 2010-11-03 ENCOUNTER — Encounter (HOSPITAL_COMMUNITY)
Admission: RE | Disposition: A | Discharge status: Home / Self Care | Payer: Self-pay | Source: Ambulatory Visit | Attending: Obstetrics and Gynecology

## 2010-11-03 ENCOUNTER — Inpatient Hospital Stay (HOSPITAL_COMMUNITY): Payer: 59 | Admitting: Anesthesiology

## 2010-11-03 ENCOUNTER — Inpatient Hospital Stay (HOSPITAL_COMMUNITY)
Admission: RE | Admit: 2010-11-03 | Discharge: 2010-11-05 | DRG: 766 | Disposition: A | Discharge status: Home / Self Care | Payer: 59 | Source: Ambulatory Visit | Attending: Obstetrics and Gynecology | Admitting: Obstetrics and Gynecology

## 2010-11-03 DIAGNOSIS — O34599 Maternal care for other abnormalities of gravid uterus, unspecified trimester: Secondary | ICD-10-CM | POA: Diagnosis present

## 2010-11-03 DIAGNOSIS — Z01818 Encounter for other preprocedural examination: Secondary | ICD-10-CM

## 2010-11-03 DIAGNOSIS — O34219 Maternal care for unspecified type scar from previous cesarean delivery: Principal | ICD-10-CM | POA: Diagnosis present

## 2010-11-03 DIAGNOSIS — Z3490 Encounter for supervision of normal pregnancy, unspecified, unspecified trimester: Secondary | ICD-10-CM

## 2010-11-03 DIAGNOSIS — N838 Other noninflammatory disorders of ovary, fallopian tube and broad ligament: Secondary | ICD-10-CM | POA: Diagnosis present

## 2010-11-03 DIAGNOSIS — Z889 Allergy status to unspecified drugs, medicaments and biological substances status: Secondary | ICD-10-CM | POA: Diagnosis present

## 2010-11-03 DIAGNOSIS — O09529 Supervision of elderly multigravida, unspecified trimester: Secondary | ICD-10-CM | POA: Diagnosis present

## 2010-11-03 DIAGNOSIS — Z01812 Encounter for preprocedural laboratory examination: Secondary | ICD-10-CM

## 2010-11-03 DIAGNOSIS — O09899 Supervision of other high risk pregnancies, unspecified trimester: Secondary | ICD-10-CM

## 2010-11-03 DIAGNOSIS — Z302 Encounter for sterilization: Secondary | ICD-10-CM

## 2010-11-03 DIAGNOSIS — Z98891 History of uterine scar from previous surgery: Secondary | ICD-10-CM

## 2010-11-03 SURGERY — Surgical Case
Anesthesia: Spinal | Laterality: Bilateral | Wound class: Clean Contaminated

## 2010-11-03 MED ORDER — IBUPROFEN 600 MG PO TABS
600.0000 mg | ORAL_TABLET | Freq: Four times a day (QID) | ORAL | Status: DC | PRN
  Filled 2010-11-03 (×8): qty 1

## 2010-11-03 MED ORDER — PRENATAL PLUS 27-1 MG PO TABS
1.0000 | ORAL_TABLET | Freq: Every day | ORAL | Status: DC
  Administered 2010-11-04 – 2010-11-05 (×2): 1 via ORAL
  Filled 2010-11-03 (×4): qty 1

## 2010-11-03 MED ORDER — METHYLERGONOVINE MALEATE 0.2 MG PO TABS: 0.2000 mg | ORAL_TABLET | ORAL | Status: DC | PRN

## 2010-11-03 MED ORDER — OXYTOCIN 10 UNIT/ML IJ SOLN
INTRAMUSCULAR | Status: AC
  Filled 2010-11-03: qty 3

## 2010-11-03 MED ORDER — METOCLOPRAMIDE HCL 10 MG PO TABS: 10.0000 mg | ORAL_TABLET | Freq: Once | ORAL | Status: DC

## 2010-11-03 MED ORDER — METRONIDAZOLE IN NACL 5-0.79 MG/ML-% IV SOLN
INTRAVENOUS | Status: DC | PRN
  Administered 2010-11-03: .5 g via INTRAVENOUS

## 2010-11-03 MED ORDER — SCOPOLAMINE 1 MG/3DAYS TD PT72
MEDICATED_PATCH | TRANSDERMAL | Status: AC
  Administered 2010-11-03: 1.5 mg
  Filled 2010-11-03: qty 1

## 2010-11-03 MED ORDER — SODIUM CHLORIDE 0.9 % IV SOLN
1.0000 ug/kg/h | INTRAVENOUS | Status: DC | PRN
  Filled 2010-11-03: qty 2.5

## 2010-11-03 MED ORDER — MORPHINE SULFATE 0.5 MG/ML IJ SOLN
INTRAMUSCULAR | Status: AC
  Filled 2010-11-03: qty 10

## 2010-11-03 MED ORDER — OXYCODONE-ACETAMINOPHEN 5-325 MG PO TABS
1.0000 | ORAL_TABLET | ORAL | Status: DC | PRN
  Administered 2010-11-04 (×2): 1 via ORAL
  Administered 2010-11-05: 2 via ORAL
  Administered 2010-11-05 (×2): 1 via ORAL
  Filled 2010-11-03 (×4): qty 1
  Filled 2010-11-03: qty 2

## 2010-11-03 MED ORDER — DIBUCAINE 1 % RE OINT
1.0000 "application " | TOPICAL_OINTMENT | RECTAL | Status: DC | PRN
  Filled 2010-11-03: qty 28

## 2010-11-03 MED ORDER — LANOLIN HYDROUS EX OINT: 1.0000 "application " | TOPICAL_OINTMENT | CUTANEOUS | Status: DC | PRN

## 2010-11-03 MED ORDER — DIPHENHYDRAMINE HCL 25 MG PO CAPS
25.0000 mg | ORAL_CAPSULE | ORAL | Status: DC | PRN
  Filled 2010-11-03: qty 1

## 2010-11-03 MED ORDER — OXYTOCIN 20 UNITS IN LACTATED RINGERS INFUSION - SIMPLE
INTRAVENOUS | Status: DC | PRN
  Administered 2010-11-03: 10 [IU] via INTRAVENOUS
  Administered 2010-11-03: 20 [IU] via INTRAVENOUS

## 2010-11-03 MED ORDER — DIPHENHYDRAMINE HCL 50 MG/ML IJ SOLN: 25.0000 mg | INTRAMUSCULAR | Status: DC | PRN

## 2010-11-03 MED ORDER — SCOPOLAMINE 1 MG/3DAYS TD PT72: 1.0000 | MEDICATED_PATCH | Freq: Once | TRANSDERMAL | Status: DC

## 2010-11-03 MED ORDER — FENTANYL CITRATE 0.05 MG/ML IJ SOLN
INTRAMUSCULAR | Status: AC
  Filled 2010-11-03: qty 2

## 2010-11-03 MED ORDER — FERROUS SULFATE 325 (65 FE) MG PO TABS
325.0000 mg | ORAL_TABLET | Freq: Two times a day (BID) | ORAL | Status: DC
  Administered 2010-11-04 – 2010-11-05 (×4): 325 mg via ORAL
  Filled 2010-11-03 (×6): qty 1

## 2010-11-03 MED ORDER — BUPIVACAINE HCL (PF) 0.25 % IJ SOLN
INTRAMUSCULAR | Status: DC | PRN
  Administered 2010-11-03: 20 mL

## 2010-11-03 MED ORDER — ONDANSETRON HCL 4 MG/2ML IJ SOLN
INTRAMUSCULAR | Status: DC | PRN
  Administered 2010-11-03: 4 mg via INTRAVENOUS

## 2010-11-03 MED ORDER — OXYTOCIN 20 UNITS IN LACTATED RINGERS INFUSION - SIMPLE: 125.0000 mL/h | INTRAVENOUS | Status: AC

## 2010-11-03 MED ORDER — CITRIC ACID-SODIUM CITRATE 334-500 MG/5ML PO SOLN: 30.0000 mL | Freq: Once | ORAL | Status: DC

## 2010-11-03 MED ORDER — SIMETHICONE 80 MG PO CHEW: 80.0000 mg | CHEWABLE_TABLET | ORAL | Status: DC | PRN

## 2010-11-03 MED ORDER — MENTHOL 3 MG MT LOZG
1.0000 | LOZENGE | OROMUCOSAL | Status: DC | PRN
  Filled 2010-11-03: qty 9

## 2010-11-03 MED ORDER — FENTANYL CITRATE 0.05 MG/ML IJ SOLN
INTRAMUSCULAR | Status: DC | PRN
  Administered 2010-11-03: 25 ug via INTRATHECAL

## 2010-11-03 MED ORDER — NALBUPHINE HCL 10 MG/ML IJ SOLN
5.0000 mg | INTRAMUSCULAR | Status: DC | PRN
  Filled 2010-11-03: qty 1

## 2010-11-03 MED ORDER — ZOLPIDEM TARTRATE 5 MG PO TABS: 5.0000 mg | ORAL_TABLET | Freq: Every evening | ORAL | Status: DC | PRN

## 2010-11-03 MED ORDER — LACTATED RINGERS IV SOLN
INTRAVENOUS | Status: DC
  Administered 2010-11-03 (×4): via INTRAVENOUS

## 2010-11-03 MED ORDER — FENTANYL CITRATE 0.05 MG/ML IJ SOLN: 25.0000 ug | INTRAMUSCULAR | Status: DC | PRN

## 2010-11-03 MED ORDER — PANTOPRAZOLE SODIUM 40 MG PO TBEC: 40.0000 mg | DELAYED_RELEASE_TABLET | Freq: Once | ORAL | Status: DC

## 2010-11-03 MED ORDER — ONDANSETRON HCL 4 MG/2ML IJ SOLN: 4.0000 mg | INTRAMUSCULAR | Status: DC | PRN

## 2010-11-03 MED ORDER — DIPHENHYDRAMINE HCL 25 MG PO CAPS
25.0000 mg | ORAL_CAPSULE | Freq: Four times a day (QID) | ORAL | Status: DC | PRN
  Filled 2010-11-03: qty 1

## 2010-11-03 MED ORDER — KETOROLAC TROMETHAMINE 30 MG/ML IJ SOLN: 30.0000 mg | Freq: Four times a day (QID) | INTRAMUSCULAR | Status: AC | PRN

## 2010-11-03 MED ORDER — FAMOTIDINE 20 MG PO TABS: 20.0000 mg | ORAL_TABLET | Freq: Once | ORAL | Status: DC

## 2010-11-03 MED ORDER — WITCH HAZEL-GLYCERIN EX PADS: 1.0000 "application " | MEDICATED_PAD | CUTANEOUS | Status: DC | PRN

## 2010-11-03 MED ORDER — BUPIVACAINE IN DEXTROSE 0.75-8.25 % IT SOLN
INTRATHECAL | Status: DC | PRN
  Administered 2010-11-03: 2 mL via INTRATHECAL

## 2010-11-03 MED ORDER — ONDANSETRON HCL 4 MG PO TABS: 4.0000 mg | ORAL_TABLET | ORAL | Status: DC | PRN

## 2010-11-03 MED ORDER — SODIUM CHLORIDE 0.9 % IJ SOLN: 3.0000 mL | INTRAMUSCULAR | Status: DC | PRN

## 2010-11-03 MED ORDER — MORPHINE SULFATE (PF) 0.5 MG/ML IJ SOLN
INTRAMUSCULAR | Status: DC | PRN
  Administered 2010-11-03: .15 mg via INTRATHECAL

## 2010-11-03 MED ORDER — TETANUS-DIPHTH-ACELL PERTUSSIS 5-2.5-18.5 LF-MCG/0.5 IM SUSP
0.5000 mL | Freq: Once | INTRAMUSCULAR | Status: AC
  Administered 2010-11-04: 0.5 mL via INTRAMUSCULAR
  Filled 2010-11-03: qty 0.5

## 2010-11-03 MED ORDER — KETOROLAC TROMETHAMINE 60 MG/2ML IM SOLN
INTRAMUSCULAR | Status: AC
  Administered 2010-11-03: 60 mg via INTRAMUSCULAR
  Filled 2010-11-03: qty 2

## 2010-11-03 MED ORDER — LACTATED RINGERS IV SOLN
INTRAVENOUS | Status: DC
  Administered 2010-11-03: 15:00:00 via INTRAVENOUS

## 2010-11-03 MED ORDER — SIMETHICONE 80 MG PO CHEW
80.0000 mg | CHEWABLE_TABLET | Freq: Three times a day (TID) | ORAL | Status: DC
  Administered 2010-11-03 – 2010-11-04 (×4): 80 mg via ORAL

## 2010-11-03 MED ORDER — METOCLOPRAMIDE HCL 5 MG/ML IJ SOLN: 10.0000 mg | Freq: Once | INTRAMUSCULAR | Status: DC | PRN

## 2010-11-03 MED ORDER — MEASLES, MUMPS & RUBELLA VAC ~~LOC~~ INJ
0.5000 mL | INJECTION | Freq: Once | SUBCUTANEOUS | Status: AC
  Administered 2010-11-04: 0.5 mL via SUBCUTANEOUS
  Filled 2010-11-03 (×2): qty 0.5

## 2010-11-03 MED ORDER — GENTAMICIN IN SALINE 1.6-0.9 MG/ML-% IV SOLN
INTRAVENOUS | Status: DC | PRN
  Administered 2010-11-03: 100 mg via INTRAVENOUS

## 2010-11-03 MED ORDER — KETOROLAC TROMETHAMINE 60 MG/2ML IM SOLN
60.0000 mg | Freq: Once | INTRAMUSCULAR | Status: AC | PRN
  Administered 2010-11-03: 60 mg via INTRAMUSCULAR

## 2010-11-03 MED ORDER — MEPERIDINE HCL 25 MG/ML IJ SOLN: 6.2500 mg | INTRAMUSCULAR | Status: DC | PRN

## 2010-11-03 MED ORDER — DIPHENHYDRAMINE HCL 50 MG/ML IJ SOLN: 12.5000 mg | INTRAMUSCULAR | Status: DC | PRN

## 2010-11-03 MED ORDER — SENNOSIDES-DOCUSATE SODIUM 8.6-50 MG PO TABS
2.0000 | ORAL_TABLET | Freq: Every day | ORAL | Status: DC
  Administered 2010-11-03 – 2010-11-04 (×2): 2 via ORAL

## 2010-11-03 MED ORDER — NALOXONE HCL 0.4 MG/ML IJ SOLN: 0.4000 mg | INTRAMUSCULAR | Status: DC | PRN

## 2010-11-03 MED ORDER — IBUPROFEN 600 MG PO TABS
600.0000 mg | ORAL_TABLET | Freq: Four times a day (QID) | ORAL | Status: DC
  Administered 2010-11-04 – 2010-11-05 (×8): 600 mg via ORAL

## 2010-11-03 MED ORDER — ONDANSETRON HCL 4 MG/2ML IJ SOLN
INTRAMUSCULAR | Status: AC
  Filled 2010-11-03: qty 2

## 2010-11-03 MED ORDER — METHYLERGONOVINE MALEATE 0.2 MG/ML IJ SOLN: 0.2000 mg | INTRAMUSCULAR | Status: DC | PRN

## 2010-11-03 SURGICAL SUPPLY — 39 items
ADH SKN CLS APL DERMABOND .7 (GAUZE/BANDAGES/DRESSINGS) ×1
APL SKNCLS STERI-STRIP NONHPOA (GAUZE/BANDAGES/DRESSINGS) ×1
BENZOIN TINCTURE PRP APPL 2/3 (GAUZE/BANDAGES/DRESSINGS) ×2 IMPLANT
BOOTIES KNEE HIGH SLOAN (MISCELLANEOUS) ×4 IMPLANT
CHLORAPREP W/TINT 26ML (MISCELLANEOUS) ×2 IMPLANT
CLOTH BEACON ORANGE TIMEOUT ST (SAFETY) ×2 IMPLANT
DERMABOND ADVANCED (GAUZE/BANDAGES/DRESSINGS) ×1
DERMABOND ADVANCED .7 DNX12 (GAUZE/BANDAGES/DRESSINGS) IMPLANT
DRAIN JACKSON PRT FLT 10 (DRAIN) IMPLANT
DRESSING TELFA 8X3 (GAUZE/BANDAGES/DRESSINGS) ×2 IMPLANT
ELECT REM PT RETURN 9FT ADLT (ELECTROSURGICAL) ×2
ELECTRODE REM PT RTRN 9FT ADLT (ELECTROSURGICAL) ×1 IMPLANT
EVACUATOR SILICONE 100CC (DRAIN) IMPLANT
EXTRACTOR VACUUM M CUP 4 TUBE (SUCTIONS) IMPLANT
GAUZE SPONGE 4X4 12PLY STRL LF (GAUZE/BANDAGES/DRESSINGS) ×4 IMPLANT
GLOVE BIOGEL PI IND STRL 7.0 (GLOVE) ×2 IMPLANT
GLOVE BIOGEL PI INDICATOR 7.0 (GLOVE) ×2
GLOVE ECLIPSE 6.5 STRL STRAW (GLOVE) ×2 IMPLANT
GOWN PREVENTION PLUS LG XLONG (DISPOSABLE) ×6 IMPLANT
KIT ABG SYR 3ML LUER SLIP (SYRINGE) IMPLANT
NDL HYPO 25X5/8 SAFETYGLIDE (NEEDLE) IMPLANT
NEEDLE HYPO 22GX1.5 SAFETY (NEEDLE) ×2 IMPLANT
NEEDLE HYPO 25X5/8 SAFETYGLIDE (NEEDLE) IMPLANT
NS IRRIG 1000ML POUR BTL (IV SOLUTION) ×4 IMPLANT
PACK C SECTION WH (CUSTOM PROCEDURE TRAY) ×2 IMPLANT
PAD ABD 7.5X8 STRL (GAUZE/BANDAGES/DRESSINGS) ×2 IMPLANT
RTRCTR C-SECT PINK 25CM LRG (MISCELLANEOUS) ×1 IMPLANT
SLEEVE SCD COMPRESS KNEE MED (MISCELLANEOUS) IMPLANT
STRIP CLOSURE SKIN 1/2X4 (GAUZE/BANDAGES/DRESSINGS) ×2 IMPLANT
SUT CHROMIC GUT AB #0 18 (SUTURE) IMPLANT
SUT MNCRL AB 3-0 PS2 27 (SUTURE) ×2 IMPLANT
SUT SILK 2 0 FSL 18 (SUTURE) IMPLANT
SUT VIC AB 0 CTX 36 (SUTURE) ×6
SUT VIC AB 0 CTX36XBRD ANBCTRL (SUTURE) ×2 IMPLANT
SUT VIC AB 1 CT1 36 (SUTURE) ×4 IMPLANT
SYR 20CC LL (SYRINGE) ×2 IMPLANT
TOWEL OR 17X24 6PK STRL BLUE (TOWEL DISPOSABLE) ×4 IMPLANT
TRAY FOLEY CATH 14FR (SET/KITS/TRAYS/PACK) ×2 IMPLANT
WATER STERILE IRR 1000ML POUR (IV SOLUTION) ×2 IMPLANT

## 2010-11-03 NOTE — H&P (Signed)
Veronica Wu is a 35 y.o. female presenting for scheduled repeat c/s with BTL. Pt is a G2P0202, at 39 wks. Pt had twins at 34wks via C/S in 2005. Pt has had PNC at [facility] since 10wks.  Pt has been followed by the MD service.  Pregnancy is significant for: Prev C/S, Hx pre-eclampsia, hx GDM, Hx PPROM - PTD, multiple ABX allergies, Pt is an Charity fundraiser, AMA, first trimester spotting.    Maternal Medical History:  Fetal activity: Perceived fetal activity is normal.   Last perceived fetal movement was within the past hour.      OB History    Grav Para Term Preterm Abortions TAB SAB Ect Mult Living   2 1 0 1     1 2      Hx of abnl pap x1. HSV, Baby A was in NICU x6 days.   Past Medical History  Diagnosis Date  . PONV (postoperative nausea and vomiting)   . Heart murmur    Past Surgical History  Procedure Date  . Cesarean section     twins  . Cholecystectomy 2008  . Tympanoplasty as child   Family History: MGF, PGF, Father - heart dx, MGM, MGF - CHTN, Father - COPD, Father - Diabetes,  Social History:  reports that she has never smoked. She does not have any smokeless tobacco history on file. She reports that she does not drink alcohol or use illicit drugs.  Pt is married to [place], She works as an Charity fundraiser. Pt is white and religion is [place]. She denies any s/s depression.  Pt denies ctx, VB, LOF reports +FM  Review of Systems  All other systems reviewed and are negative.      Blood pressure 133/80, pulse 82, temperature 98.2 F (36.8 C), resp. rate 18, last menstrual period 02/02/2010, SpO2 100.00%. Maternal Exam:  Abdomen: Patient reports no abdominal tenderness. Fundal height is aga.   Estimated fetal weight is 7#10z.   Fetal presentation: vertex  Introitus: not evaluated.   Cervix: not evaluated.   Fetal Exam Fetal Monitor Review: Mode: hand-held doppler probe.   Baseline rate: 145.      Physical Exam  Constitutional: She is oriented to person, place, and time. She appears  well-developed and well-nourished.  Neck: Normal range of motion.  Cardiovascular: Normal rate, regular rhythm and normal heart sounds.   Respiratory: Effort normal and breath sounds normal.  GI: Soft. Bowel sounds are normal.  Musculoskeletal: Normal range of motion. She exhibits no edema.  Neurological: She is alert and oriented to person, place, and time. She has normal reflexes.  Skin: Skin is warm and dry.  Psychiatric: She has a normal mood and affect. Her behavior is normal. Judgment and thought content normal.    Prenatal labs: ABO, Rh: --/--/A NEG (07/29 1137) Antibody: NEG (07/29 1137) Rubella: Equivocal (03/19 0000) RPR: NON REACTIVE (09/27 1156)  HBsAg: Negative (03/19 0000)  HIV: Non-reactive (03/19 0000)  GBS:   neg per pt HgbA1c - 5.3 UA cx - neg Declined genetic testing 1hr gtt - abnl 3hr gtt - 1 abnl/4   Assessment/Plan: Admit to Pre-op Routine orders per Dr. Estanislado Pandy - antibiotic orders given Repeat C/S with BTL, R&B reviewed including risk of damage to surrounding organs, bleeding, anesthesia complications or infection. Pt understands these risks and wishes to proceed.   Cassidy Tabet M 11/03/2010, 9:12 AM

## 2010-11-03 NOTE — Anesthesia Procedure Notes (Signed)
Spinal Block  Patient location during procedure: OR Start time: 11/03/2010 11:00 AM Staffing Anesthesiologist: Kyl Givler A. Performed by: anesthesiologist  Preanesthetic Checklist Completed: patient identified, site marked, surgical consent, pre-op evaluation, timeout performed, IV checked, risks and benefits discussed and monitors and equipment checked Spinal Block Patient position: sitting Prep: site prepped and draped and DuraPrep Patient monitoring: heart rate, cardiac monitor, continuous pulse ox and blood pressure Approach: midline Location: L3-4 Injection technique: single-shot Needle Needle type: Sprotte  Needle gauge: 24 G Needle length: 9 cm Needle insertion depth: 4 cm Assessment Sensory level: T4 Additional Notes Patient tolerated procedure well. Adequate surgical anesthetic level.

## 2010-11-03 NOTE — Brief Op Note (Signed)
  11/03/2010  12:28 PM  PATIENT:  Veronica Wu  35 y.o. female  PRE-OPERATIVE DIAGNOSIS:  Previous Cesarean Section; Desires Sterilization  POST-OPERATIVE DIAGNOSIS:  Previous Cesarean Section; Desires Sterilization  PROCEDURE:  Procedure(s): CESAREAN SECTION WITH BILATERAL TUBAL LIGATION  SURGEON:  Surgeon(s): Esmeralda Arthur, MD MD  ASSISTANTS: Sanda Klein CNM   ANESTHESIA:   local and spinal  LOCAL MEDICATIONS USED:  MARCAINE 20 CC  SPECIMEN:  placenta  DISPOSITION OF SPECIMEN:  L & D  COUNTS:  YES  ESTIMATED BLOOD LOSS: 600 cc  PATIENT DISPOSITION:  PACU - hemodynamically stable.       [id number],YQMVHQ A MD 11/03/2010 12:28 PM

## 2010-11-03 NOTE — OR Nursing (Addendum)
Procedure:  Repeat Cesarian Section with Bilateral Tubal Ligations with Excision of Right Paratubal Cyst

## 2010-11-03 NOTE — Anesthesia Preprocedure Evaluation (Signed)
Anesthesia Evaluation  Name, MR# and DOB Patient awake  General Assessment Comment  Reviewed: Allergy & Precautions, H&P , Patient's Chart, lab work & pertinent test results  History of Anesthesia Complications (+) PONV  Airway Mallampati: II TM Distance: >3 FB Neck ROM: Full    Dental No notable dental hx.    Pulmonary  clear to auscultation  Pulmonary exam normal       Cardiovascular Regular Normal    Neuro/Psych Negative Neurological ROS  Negative Psych ROS   GI/Hepatic negative GI ROS Neg liver ROS    Endo/Other  Negative Endocrine ROS  Renal/GU negative Renal ROS  Genitourinary negative   Musculoskeletal negative musculoskeletal ROS (+)   Abdominal Normal abdominal exam  (+)   Peds  Hematology negative hematology ROS (+)   Anesthesia Other Findings   Reproductive/Obstetrics (+) Pregnancy                           Anesthesia Physical Anesthesia Plan  ASA: II  Anesthesia Plan: Spinal   Post-op Pain Management:    Induction:   Airway Management Planned: Natural Airway  Additional Equipment:   Intra-op Plan:   Post-operative Plan:   Informed Consent: I have reviewed the patients History and Physical, chart, labs and discussed the procedure including the risks, benefits and alternatives for the proposed anesthesia with the patient or authorized representative who has indicated his/her understanding and acceptance.   Dental Advisory Given  Plan Discussed with: Anesthesiologist and CRNA  Anesthesia Plan Comments:         Anesthesia Quick Evaluation

## 2010-11-03 NOTE — Op Note (Signed)
Preoperative diagnosis: Intrauterine pregnancy at 39 weeks and 0 days, previous cesarean, desire for sterilization  Post operative diagnosis: Same  Anesthesia: Spinal  Anesthesiologist: Dr. Malen Gauze  Procedure: Primary low transverse cesarean section, Bilateral tubal ligation  Surgeon: Dr. Dois Davenport Avonell Lenig  Assistant: Sanda Klein CNM  Estimated blood loss: 600 cc  Procedure:  After being informed of the planned procedure and possible complications including bleeding, infection, injury to other organs, informed consent is obtained. The patient is taken to OR #1 and given spinal anesthesia without complication. She is placed in the dorsal decubitus position with the pelvis tilted to the left. She is then prepped and draped in a sterile fashion. A Foley catheter is inserted in her bladder.  After assessing adequate level of anesthesia, we infiltrate the suprapubic area with 20 cc of Marcaine 0.25 and perform a Pfannenstiel incision which is brought down sharply to the fascia. The fascia is entered in a low transverse fashion. Linea alba is dissected. Peritoneum is entered in a midline fashion. An Alexis retractor is easily positioned. Visceral peritoneum is entered in a low transverse fashion allowing [place] to safely retract bladder by developing a bladder flap.  The myometrium is then entered in a low transverse fashion; first with knife and then extended bluntly. Amniotic fluid is thin meconium. We assist the birth of a female  infant in vertex presentation. Mouth and nose are suctioned. The baby is delivered. The cord is clamped and sectioned. The baby is given to the neonatologist present in the room.  10 cc of blood is drawn from the umbilical vein.The placenta is allowed to deliver spontaneously. It is complete and the cord has 3 vessels. Uterine revision is negative.  We proceed with closure of the myometrium in 2 layers: First with a running locked suture of 0 Vicryl, then with a Lembert  suture of 0 Vicryl imbricating the first one. Hemostasis is completed with cauterization on peritoneal edges.  Both paracolic gutters are cleaned. Both tubes and ovaries are assessed and normal. The pelvis is profusely irrigated with warm saline to confirm a satisfactory hemostasis.  We then proceed with bilateral tubal ligation. The left tube is isolated with 2 Babcock forceps. Its mesosalpinx was entered with cauterization. We doubly ligate the proximal portion and the distal portion with 0 chromic. We removed  a 1.5 cm section of a isthmo-ampullary tube. Both stumps are then cauterized and hemostasis is adequate. We proceed in exact same fashion on the right side. A 3 cm paratubal cyst on the right side is then isolated on a hemostat, removed, doubly tied with the 3-0 chromic suture. Hemostasis is adequate.   Retractors and sponges are removed. Under fascia hemostasis is completed with cauterization. The fascia is then closed with 2 running sutures of 0 Vicryl meeting midline. The wound is irrigated with warm saline and hemostasis is completed with cauterization. The skin is closed with a subcuticular suture of 3-0 Monocryl and Steri-Strips.  Instrument and sponge count is complete x2. Estimated blood loss is 600 cc.  The procedure is well tolerated by the patient who is taken to recovery room in a well and stable condition.  female baby named Greig Castilla was born at 11:27 and received an Apgar of 9  at 1 minute and 10 at 5 minutes. Weight was 8 lbs  10 oz.    Specimen: Placenta sent to L & D  2 segments of tube sent to pathology           Right paratubal cyst sent to pathology   [facility] A MD 10/3/201212:32 PM

## 2010-11-03 NOTE — Transfer of Care (Signed)
Immediate Anesthesia Transfer of Care Note  Patient: Veronica Wu  Procedure(s) Performed:  CESAREAN SECTION WITH BILATERAL TUBAL LIGATION  Patient Location: PACU  Anesthesia Type: Spinal  Level of Consciousness: awake  Airway & Oxygen Therapy: Patient Spontanous Breathing  Post-op Assessment: Report given to PACU RN and Post -op Vital signs reviewed and stable  Post vital signs: Reviewed and stable  Complications: No apparent anesthesia complications

## 2010-11-03 NOTE — Interval H&P Note (Signed)
History and Physical Interval Note:   11/03/2010   10:12 AM   Veronica Wu  has presented today for surgery, with the diagnosis of Previous Cesarean Section; Desires Sterilization  The various methods of treatment have been discussed with the patient and family. After consideration of risks, benefits and other options for treatment, the patient has consented to  Procedure(s): CESAREAN SECTION WITH BILATERAL TUBAL LIGATION as a surgical intervention .  I have reviewed the patients' chart and labs.  Questions were answered to the patient's satisfaction.     Silverio Lay A  MD

## 2010-11-03 NOTE — Consult Note (Signed)
Requested to attend elective repeat C/S at term gestation. At birth infant in vertex and was manually extracted with spontaneous cries and active movement of all extremities. Given tactile stim with drying and bulb suction to naso/oropharynx. No dysmorphic features.    Shown to parents then carried to Transitional Nursery.  Care to assigned pediatrician.   Dagoberto Ligas MD [facility]

## 2010-11-03 NOTE — Anesthesia Postprocedure Evaluation (Signed)
  Anesthesia Post-op Note  Patient: Veronica Wu  Procedure(s) Performed:  CESAREAN SECTION WITH BILATERAL TUBAL LIGATION  Patient Location: Mother/Baby  Anesthesia Type: Spinal  Level of Consciousness: awake, alert  and oriented  Airway and Oxygen Therapy: Patient Spontanous Breathing  Post-op Pain: none  Post-op Assessment: Post-op Vital signs reviewed and Patient's Cardiovascular Status Stable  Post-op Vital Signs: Reviewed and stable  Complications: No apparent anesthesia complications

## 2010-11-03 NOTE — Anesthesia Postprocedure Evaluation (Signed)
  Anesthesia Post-op Note  Patient: Veronica Wu  Procedure(s) Performed:  CESAREAN SECTION WITH BILATERAL TUBAL LIGATION   Patient is awake, responsive, moving her legs, and has signs of resolution of her numbness. Pain and nausea are reasonably well controlled. Vital signs are stable and clinically acceptable. Oxygen saturation is clinically acceptable. There are no apparent anesthetic complications at this time. Patient is ready for discharge.

## 2010-11-03 NOTE — Progress Notes (Signed)
Encounter addended by: Madison Hickman on: 11/03/2010  6:22 PM<BR>     Documentation filed: Notes Section

## 2010-11-04 LAB — CBC
MCH: 28.6 pg (ref 26.0–34.0)
MCHC: 32.8 g/dL (ref 30.0–36.0)
Platelets: 178 10*3/uL (ref 150–400)
RDW: 13.9 % (ref 11.5–15.5)

## 2010-11-04 LAB — KLEIHAUER-BETKE STAIN
Fetal Cells %: 0 %
Quantitation Fetal Hemoglobin: 0 mL

## 2010-11-04 LAB — CCBB MATERNAL DONOR DRAW

## 2010-11-04 MED ORDER — RHO D IMMUNE GLOBULIN 1500 UNIT/2ML IJ SOLN
300.0000 ug | Freq: Once | INTRAMUSCULAR | Status: AC
  Administered 2010-11-04: 300 ug via INTRAMUSCULAR
  Filled 2010-11-04: qty 2

## 2010-11-04 NOTE — Progress Notes (Signed)
  Subjective: Postpartum Day 1: Cesarean Delivery with BTL Patient reports incisional pain, tolerating PO, + flatus and no problems voiding.  BF going well, +bonding, +mood  Objective: Vital signs in last 24 hours: Temp:  [97.9 F (36.6 C)-98.6 F (37 C)] 98.6 F (37 C) (10/04 0000) Pulse Rate:  [59-96] 68  (10/04 0000) Resp:  [18] 18  (10/04 0000) BP: (103-133)/(52-80) 103/66 mmHg (10/04 0000) SpO2:  [96 %-100 %] 96 % (10/04 0700) Weight:  [78.019 kg (172 lb)] 172 lb (78.019 kg) (10/03 1337)  Physical Exam:  General: alert and no distress Lochia: appropriate Uterine Fundus: firm Incision: dressing C/D/I DVT Evaluation: No evidence of DVT seen on physical exam. Negative Homan's sign.   Basename 11/04/10 0535  HGB 10.6*  HCT 32.3*    Assessment/Plan: Status post Cesarean section. Doing well postoperatively.  Continue current care  Pt to receive Rhogam studies and MMR, & TDAP before D/C  Ralonda Tartt M 11/04/2010, 8:41 AM

## 2010-11-05 DIAGNOSIS — Z98891 History of uterine scar from previous surgery: Secondary | ICD-10-CM | POA: Diagnosis present

## 2010-11-05 LAB — RH IG WORKUP (INCLUDES ABO/RH)
ABO/RH(D): A NEG
Gestational Age(Wks): 39

## 2010-11-05 MED ORDER — OXYCODONE-ACETAMINOPHEN 5-325 MG PO TABS: 1.0000 | ORAL_TABLET | ORAL | Status: AC | PRN

## 2010-11-05 MED ORDER — IBUPROFEN 600 MG PO TABS: 600.0000 mg | ORAL_TABLET | Freq: Four times a day (QID) | ORAL | Status: AC | PRN

## 2010-11-05 NOTE — Progress Notes (Signed)
Subjective: Postpartum Day 2: Cesarean Delivery Patient reports no problems voiding.  Doing well, desires d/c today.  Breastfeeding going well.  Patient's pain well-controlled with po meds.    Objective: Vital signs in last 24 hours: Temp:  [98.1 F (36.7 C)-98.5 F (36.9 C)] 98.1 F (36.7 C) (10/05 0600) Pulse Rate:  [63-72] 72  (10/05 0600) Resp:  [17-18] 18  (10/05 0600) BP: (108-120)/(69-80) 108/69 mmHg (10/05 0600) SpO2:  [99 %] 99 % (10/04 2110)  Physical Exam:  General: alert Lochia: appropriate Uterine Fundus: firm Incision: healing well DVT Evaluation: No evidence of DVT seen on physical exam. Negative Homan's sign.   Basename 11/04/10 0535  HGB 10.6*  HCT 32.3*    Assessment/Plan: Status post Cesarean section. Doing well postoperatively.  Discharge home with standard precautions and return to clinic in 4-6 weeks. Rx Motrin and Percocet. Follow-up in 6 weeks or prn.  Nigel Bridgeman 11/05/2010, 9:31 AM

## 2010-11-05 NOTE — Discharge Summary (Signed)
Obstetric Discharge Summary Reason for Admission: Scheduled repeat cesarean birth with tubal ligation Prenatal Procedures: none Intrapartum Procedures: cesarean: low cervical, transverse Postpartum Procedures: none Complications-Operative and Postpartum: none Hemoglobin  Date Value Range Status  11/04/2010 10.6* 12.0-15.0 (g/dL) Final     HCT  Date Value Range Status  11/04/2010 32.3* 36.0-46.0 (%) Final    Discharge Diagnoses: Term Pregnancy-delivered                                         Previous Cesarean birth--desires repeat                                          Desired BTL  Hospital Course:  Admitted for repeat C/S and tubal ligation.  Surgery and post-operative course were uncomplicated.  Infant was breastfeeding without difficulty.  Patient desired early d/c on day 2.  Physical exam was WNL, with LTCS incision CDI.  Patient discharged home in stable condition on 11/05/10.  Discharge Information: Date: 11/05/2010 Activity: Per [facility] handout Diet: routine Medications: Ibuprofen and Percocet Condition: stable Instructions: refer to practice specific booklet Discharge to: home Follow-up Information    Follow up with [facility] in 6 weeks.         Newborn Data: Live born female  Birth Weight: 8 lb 9.9 oz (3910 g) APGAR: 9, 10  Home with mother.  Veronica Wu 11/05/2010, 8:09 AM

## 2010-11-08 LAB — URINE MICROSCOPIC-ADD ON

## 2010-11-08 LAB — HEPATIC FUNCTION PANEL
AST: 114 — ABNORMAL HIGH
Albumin: 4
Total Protein: 7.2

## 2010-11-08 LAB — LIPASE, BLOOD: Lipase: 23

## 2010-11-08 LAB — POCT PREGNANCY, URINE
Operator id: 294501
Preg Test, Ur: NEGATIVE

## 2010-11-08 LAB — DIFFERENTIAL
Basophils Absolute: 0.1
Lymphocytes Relative: 20
Monocytes Absolute: 0.7
Monocytes Relative: 8
Neutro Abs: 6

## 2010-11-08 LAB — POCT CARDIAC MARKERS
Operator id: 294501
Troponin i, poc: <0.05

## 2010-11-08 LAB — BASIC METABOLIC PANEL
Calcium: 8.9
GFR calc Af Amer: >60
GFR calc non Af Amer: >60
Sodium: 138

## 2010-11-08 LAB — CBC
Hemoglobin: 14.8
RBC: 4.93
RDW: 12.8

## 2010-11-08 LAB — URINALYSIS, ROUTINE W REFLEX MICROSCOPIC
Glucose, UA: NEGATIVE
Hgb urine dipstick: NEGATIVE
Ketones, ur: NEGATIVE
Nitrite: NEGATIVE
pH: 8

## 2011-05-13 ENCOUNTER — Telehealth: Payer: Self-pay

## 2011-05-13 MED ORDER — VALACYCLOVIR HCL 500 MG PO TABS: 500.0000 mg | ORAL_TABLET | Freq: Two times a day (BID) | ORAL | Status: DC

## 2011-05-13 NOTE — Telephone Encounter (Signed)
Rx Macrobid requested also

## 2011-05-13 NOTE — Telephone Encounter (Signed)
OK to call Macrobid 100 mg po post-coital #30/1 year

## 2011-05-16 MED ORDER — NITROFURANTOIN MONOHYD MACRO 100 MG PO CAPS: 100.0000 mg | ORAL_CAPSULE | ORAL | Status: DC

## 2011-05-16 NOTE — Telephone Encounter (Signed)
Rx done   ld

## 2011-05-16 NOTE — Telephone Encounter (Signed)
Addended by: Larwance Rote on: 05/16/2011 09:14 AM   Modules accepted: Orders

## 2011-05-24 ENCOUNTER — Telehealth: Payer: Self-pay

## 2011-05-24 MED ORDER — NITROFURANTOIN MONOHYD MACRO 100 MG PO CAPS: 100.0000 mg | ORAL_CAPSULE | ORAL | Status: AC

## 2011-05-24 NOTE — Telephone Encounter (Signed)
Rx Macrobid fax request sent in.  ld

## 2011-12-15 ENCOUNTER — Ambulatory Visit: Payer: 59 | Admitting: Obstetrics and Gynecology

## 2011-12-15 ENCOUNTER — Telehealth: Payer: Self-pay

## 2011-12-15 NOTE — Telephone Encounter (Signed)
Pt requesting aex time before Feb.  Pt r/s for 12-23-11  Ok per SR  ld

## 2011-12-23 ENCOUNTER — Encounter: Payer: Self-pay | Admitting: Obstetrics and Gynecology

## 2011-12-23 ENCOUNTER — Ambulatory Visit (INDEPENDENT_AMBULATORY_CARE_PROVIDER_SITE_OTHER): Payer: 59 | Admitting: Obstetrics and Gynecology

## 2011-12-23 VITALS — BP 90/52 | Resp 16 | Ht 64.0 in | Wt 129.0 lb

## 2011-12-23 DIAGNOSIS — Z01419 Encounter for gynecological examination (general) (routine) without abnormal findings: Secondary | ICD-10-CM

## 2011-12-23 DIAGNOSIS — Z124 Encounter for screening for malignant neoplasm of cervix: Secondary | ICD-10-CM

## 2011-12-23 MED ORDER — DROSPIRENONE-ETHINYL ESTRADIOL 3-0.02 MG PO TABS: 1.0000 | ORAL_TABLET | Freq: Every day | ORAL | Status: DC

## 2011-12-23 NOTE — Progress Notes (Signed)
The patient reports:she complains of break thru bleeding days 12-14 & "horrible" PMS Contraception:bilateral tubal ligation   Last mammogram: patient has never had a mammogram Last pap: December 14, 2010  GC/Chlamydia cultures offered: declined HIV/RPR/HbsAg offered:  declined HSV 1 and 2 glycoprotein offered: declined  Menstrual cycle regular and monthly: No  Menstrual flow normal: No "heavy"  Urinary symptoms: none Normal bowel movements: Yes Reports abuse at home: No   Subjective:    Veronica Wu is a 36 y.o. female, G2P1103, who presents for an annual exam.     History   Social History  . Marital Status: Married    Spouse Name: N/A    Number of Children: N/A  . Years of Education: N/A   Social History Main Topics  . Smoking status: Never Smoker   . Smokeless tobacco: Never Used  . Alcohol Use: No  . Drug Use: No  . Sexually Active: Yes -- Female partner(s)    Birth Control/ Protection: Surgical     Comment: BTL    Other Topics Concern  . None   Social History Narrative  . None    Menstrual cycle:   LMP: Patient's last menstrual period was 12/13/2011.           Cycle: Monthly cycles with some breakthrough bleeding days 12-14. Day bleeding 7, with 4 heavy days. Changing pad every 2 hours. Mild discomfort cramps.   The following portions of the patient's history were reviewed and updated as appropriate: allergies, current medications, past family history, past medical history, past social history, past surgical history and problem list.  Review of Systems Pertinent items are noted in HPI. Breast:Negative for breast lump,nipple discharge or nipple retraction Gastrointestinal: Negative for abdominal pain, change in bowel habits or rectal bleeding Urinary:negative   Objective:    BP 90/52  Resp 16  Wt 129 lb (58.514 kg)  LMP 12/13/2011  Breastfeeding? No    Weight:  Wt Readings from Last 1 Encounters:  12/23/11 129 lb (58.514 kg)          BMI: There is no  height on file to calculate BMI.  General Appearance: Alert, appropriate appearance for age. No acute distress HEENT: Grossly normal Neck / Thyroid: Supple, no masses, nodes or enlargement Lungs: clear to auscultation bilaterally Back: No CVA tenderness Breast Exam: No masses or nodes.No dimpling, nipple retraction or discharge. Cardiovascular: Regular rate and rhythm. S1, S2, no murmur Gastrointestinal: Soft, non-tender, no masses or organomegaly Pelvic Exam: Vulva and vagina appear normal. Bimanual exam reveals normal uterus and adnexa. Rectovaginal: not indicated Lymphatic Exam: Non-palpable nodes in neck, clavicular, axillary, or inguinal regions Skin: no rash or abnormalities Neurologic: Normal gait and speech, no tremor  Psychiatric: Alert and oriented, appropriate affect.    Assessment:    Normal gyn exam  Worsening PMS with heavier cycles for which pt wants trial of BCP   Plan:    pap smear return annually or prn STD screening: declined Contraception:bilateral tubal ligation Combined oral contraceptives was reviewed with the patient      With expected benefits of: cycle control, reduction in menstrual flow and dysmenorrhea, improvement of PMS and reduction of ovarian cysts and ovarian cancer. Risks of DVT/PE discussed.  Nuvaring was also reviewed With added benefit of monthly use as opposed to daily use was also reviewed.  Tobacco use: none, withouthistory of DVT/PE. Pertinent medical history:none  Rx Yaz given  Patient tearful: Jimmey Ralph may have  Muscular dystrophy. Awaiting appointment at [facility].  Silverio Lay MD

## 2011-12-23 NOTE — Progress Notes (Deleted)
Subjective:     Patient ID: Veronica Wu, female   DOB: [birth year], 36 y.o.   MRN: [id number]  HPI   Review of Systems     Objective:   Physical Exam     Assessment:     ***    Plan:     ***

## 2011-12-26 LAB — PAP IG W/ RFLX HPV ASCU

## 2011-12-30 NOTE — Progress Notes (Signed)
Quick Note:  Please send ASCUS letter. Pap in 1 year. ______ 

## 2012-01-02 ENCOUNTER — Encounter: Payer: Self-pay | Admitting: Obstetrics and Gynecology

## 2012-03-13 ENCOUNTER — Telehealth: Payer: Self-pay | Admitting: Obstetrics and Gynecology

## 2012-03-13 DIAGNOSIS — Z760 Encounter for issue of repeat prescription: Secondary | ICD-10-CM

## 2012-03-13 MED ORDER — DROSPIRENONE-ETHINYL ESTRADIOL 3-0.02 MG PO TABS: 1.0000 | ORAL_TABLET | Freq: Every day | ORAL | Status: DC

## 2012-03-13 MED ORDER — VALACYCLOVIR HCL 500 MG PO TABS: 500.0000 mg | ORAL_TABLET | Freq: Two times a day (BID) | ORAL | Status: DC

## 2012-03-13 NOTE — Telephone Encounter (Signed)
Spoke with pt rgd msg. Pt stated that her insurance is requesting pt to get a 90 day supply on her rx. Approved rx for yaz disp 3 packs sig 1 po qd with 3 refills . Valtrex 500 mg sig 2 po qd disp 90 with 3 refills. Pt aware of rx refills .

## 2013-12-02 ENCOUNTER — Encounter: Payer: Self-pay | Admitting: Obstetrics and Gynecology

## 2014-02-17 ENCOUNTER — Other Ambulatory Visit: Payer: Self-pay | Admitting: Obstetrics and Gynecology

## 2014-02-17 ENCOUNTER — Other Ambulatory Visit: Payer: Self-pay | Admitting: Internal Medicine

## 2014-02-17 DIAGNOSIS — Z1231 Encounter for screening mammogram for malignant neoplasm of breast: Secondary | ICD-10-CM

## 2014-02-27 ENCOUNTER — Ambulatory Visit
Admission: RE | Admit: 2014-02-27 | Discharge: 2014-02-27 | Disposition: A | Discharge status: Home / Self Care | Payer: BLUE CROSS/BLUE SHIELD | Source: Ambulatory Visit | Attending: Obstetrics and Gynecology | Admitting: Obstetrics and Gynecology

## 2014-02-27 DIAGNOSIS — Z1231 Encounter for screening mammogram for malignant neoplasm of breast: Secondary | ICD-10-CM

## 2014-02-28 ENCOUNTER — Other Ambulatory Visit: Payer: Self-pay | Admitting: Obstetrics and Gynecology

## 2014-02-28 DIAGNOSIS — R928 Other abnormal and inconclusive findings on diagnostic imaging of breast: Secondary | ICD-10-CM

## 2014-03-12 ENCOUNTER — Ambulatory Visit
Admission: RE | Admit: 2014-03-12 | Discharge: 2014-03-12 | Disposition: A | Discharge status: Home / Self Care | Payer: BLUE CROSS/BLUE SHIELD | Source: Ambulatory Visit | Attending: Obstetrics and Gynecology | Admitting: Obstetrics and Gynecology

## 2014-03-12 ENCOUNTER — Other Ambulatory Visit: Payer: Self-pay | Admitting: Obstetrics and Gynecology

## 2014-03-12 DIAGNOSIS — R928 Other abnormal and inconclusive findings on diagnostic imaging of breast: Secondary | ICD-10-CM

## 2014-03-12 DIAGNOSIS — N6320 Unspecified lump in the left breast, unspecified quadrant: Secondary | ICD-10-CM

## 2014-03-19 ENCOUNTER — Ambulatory Visit
Admission: RE | Admit: 2014-03-19 | Discharge: 2014-03-19 | Disposition: A | Discharge status: Home / Self Care | Payer: BLUE CROSS/BLUE SHIELD | Source: Ambulatory Visit | Attending: Obstetrics and Gynecology | Admitting: Obstetrics and Gynecology

## 2014-03-19 DIAGNOSIS — N6320 Unspecified lump in the left breast, unspecified quadrant: Secondary | ICD-10-CM

## 2014-03-25 ENCOUNTER — Other Ambulatory Visit: Payer: BLUE CROSS/BLUE SHIELD

## 2014-10-15 ENCOUNTER — Other Ambulatory Visit: Payer: Self-pay | Admitting: Obstetrics and Gynecology

## 2014-10-15 DIAGNOSIS — N6320 Unspecified lump in the left breast, unspecified quadrant: Secondary | ICD-10-CM

## 2014-10-27 ENCOUNTER — Other Ambulatory Visit: Payer: BLUE CROSS/BLUE SHIELD

## 2014-10-30 ENCOUNTER — Ambulatory Visit
Admission: RE | Admit: 2014-10-30 | Discharge: 2014-10-30 | Disposition: A | Discharge status: Home / Self Care | Payer: BLUE CROSS/BLUE SHIELD | Source: Ambulatory Visit | Attending: Obstetrics and Gynecology | Admitting: Obstetrics and Gynecology

## 2014-10-30 DIAGNOSIS — N6320 Unspecified lump in the left breast, unspecified quadrant: Secondary | ICD-10-CM

## 2015-02-17 ENCOUNTER — Other Ambulatory Visit: Payer: Self-pay | Admitting: Obstetrics and Gynecology

## 2015-02-17 DIAGNOSIS — N63 Unspecified lump in unspecified breast: Secondary | ICD-10-CM

## 2015-04-30 ENCOUNTER — Other Ambulatory Visit: Payer: Self-pay | Admitting: Obstetrics and Gynecology

## 2015-04-30 ENCOUNTER — Ambulatory Visit
Admission: RE | Admit: 2015-04-30 | Discharge: 2015-04-30 | Disposition: A | Discharge status: Home / Self Care | Payer: BLUE CROSS/BLUE SHIELD | Source: Ambulatory Visit | Attending: Obstetrics and Gynecology | Admitting: Obstetrics and Gynecology

## 2015-04-30 DIAGNOSIS — N63 Unspecified lump in unspecified breast: Secondary | ICD-10-CM

## 2016-02-01 DIAGNOSIS — Z86718 Personal history of other venous thrombosis and embolism: Secondary | ICD-10-CM | POA: Insufficient documentation

## 2016-02-01 HISTORY — DX: Personal history of other venous thrombosis and embolism: Z86.718

## 2016-04-27 ENCOUNTER — Emergency Department: Payer: BLUE CROSS/BLUE SHIELD

## 2016-04-27 ENCOUNTER — Emergency Department (INDEPENDENT_AMBULATORY_CARE_PROVIDER_SITE_OTHER)
Admission: EM | Admit: 2016-04-27 | Discharge: 2016-04-27 | Disposition: A | Discharge status: Home / Self Care | Payer: BLUE CROSS/BLUE SHIELD | Source: Home / Self Care | Attending: Family Medicine | Admitting: Family Medicine

## 2016-04-27 ENCOUNTER — Encounter: Payer: Self-pay | Admitting: Emergency Medicine

## 2016-04-27 DIAGNOSIS — I8002 Phlebitis and thrombophlebitis of superficial vessels of left lower extremity: Secondary | ICD-10-CM

## 2016-04-27 DIAGNOSIS — M7989 Other specified soft tissue disorders: Secondary | ICD-10-CM

## 2016-04-27 DIAGNOSIS — M79662 Pain in left lower leg: Secondary | ICD-10-CM

## 2016-04-27 NOTE — Discharge Instructions (Signed)
°  You do have superficial (close to the surface) clot in your varicose veins. It is recommended you take ibuprofen or aspirin daily for 1 week, use over the counter compression stockings, elevate your leg and use heat such as a warm damp wash cloth or heating pad 15-20 minutes at a time, 2-3 times daily for 1 week. If symptoms not improving, please follow up with primary care or Sports Medicine for further evaluation and treatment of superficial thrombophlebitis.  There do not appear to be any deep (DVT) clots at this time, however, you may be more prone to these types of clots. If you develop chest pain or shortness of breath, be sure to seek medical attention immediately.

## 2016-04-27 NOTE — ED Triage Notes (Addendum)
Pt c/o left leg swelling and lump in her varicose vein. Warm and tender to touch x3 days. She has been using heat and taking asa. She is currently taking oral contraceptives.

## 2016-04-27 NOTE — ED Provider Notes (Signed)
CSN: [id number]     Arrival date & time 04/27/16  0930 History   None    Chief Complaint  Patient presents with  . Leg Swelling   (Consider location/radiation/quality/duration/timing/severity/associated sxs/prior Treatment) HPI  Veronica Wu is a 41 y.o. female presenting to UC with c/o 3 days of gradually worsening Left lower leg pain, swelling, and warmth of her chronic varicose veins.  She notes she is a busy mother but has been trying to elevate leg and apply heat but no relief.  Pain is aching and sore, 4/10.  Denies chest pain or SOB. No hx of blood clots in the past. Denies recent injury or recent travel.    Past Medical History:  Diagnosis Date  . Heart murmur   . PONV (postoperative nausea and vomiting)    Past Surgical History:  Procedure Laterality Date  . CESAREAN SECTION     twins  . CHOLECYSTECTOMY  2008  . TYMPANOPLASTY  as child   Family History  Problem Relation Age of Onset  . Diabetes Father   . Heart disease Father    Social History  Substance Use Topics  . Smoking status: Never Smoker  . Smokeless tobacco: Never Used  . Alcohol use No   OB History    Gravida Para Term Preterm AB Living   2 2 1 1   3    SAB TAB Ectopic Multiple Live Births         1 1     Review of Systems  Constitutional: Negative for chills and fever.  Respiratory: Negative for chest tightness, shortness of breath and wheezing.   Cardiovascular: Positive for leg swelling. Negative for chest pain and palpitations.  Musculoskeletal: Positive for myalgias. Negative for arthralgias and joint swelling.  Skin: Negative for color change, rash and wound.  Neurological: Negative for weakness and numbness.    Allergies  Penicillins; Erythromycin; Sulfa antibiotics; Clindamycin/lincomycin; and Floxin [ofloxacin]  Home Medications   Prior to Admission medications   Medication Sig Start Date End Date Taking? Authorizing Provider  drospirenone-ethinyl estradiol (YAZ,GIANVI,LORYNA) 3-0.02  MG tablet Take 1 tablet by mouth daily. 03/13/12   Jaymes Graff, MD  folic acid (FOLVITE) 1 MG tablet Take 1 mg by mouth daily.      Historical Provider, MD  Omega-3 Fatty Acids (OMEGA 3 PO) Take 1 capsule by mouth daily. Pt takes Nutralite product    Historical Provider, MD  prenatal vitamin w/FE, FA (PRENATAL 1 + 1) 27-1 MG TABS Take 1 tablet by mouth daily. Pt takes a product from [employer].     Historical Provider, MD  valACYclovir (VALTREX) 500 MG tablet Take 1 tablet (500 mg total) by mouth 2 (two) times daily. 03/13/12 03/18/12  Jaymes Graff, MD   Meds Ordered and Administered this Visit  Medications - No data to display  BP 116/85 (BP Location: Right Arm)   Pulse 98   Temp 98 F (36.7 C) (Oral)   Wt 150 lb (68 kg)   LMP 04/20/2016 (Approximate)   SpO2 100%   BMI 25.75 kg/m  No data found.   Physical Exam  Constitutional: She is oriented to person, place, and time. She appears well-developed and well-nourished. No distress.  HENT:  Head: Normocephalic and atraumatic.  Eyes: EOM are normal.  Neck: Normal range of motion.  Cardiovascular: Normal rate.   Pulmonary/Chest: Effort normal. No respiratory distress.  Musculoskeletal: Normal range of motion. She exhibits tenderness. She exhibits no edema.  Left lower leg: tenderness to medial aspect (  see skin exam)   Neurological: She is alert and oriented to person, place, and time.  Skin: Skin is warm and dry. Capillary refill takes less than 2 seconds. She is not diaphoretic. No erythema.  Left lower leg: skin in tact. No erythema or warmth. Visible and palpable varicose veins to medial aspect of leg. Palpable tender nodules along varicose veins.   Psychiatric: She has a normal mood and affect. Her behavior is normal.  Nursing note and vitals reviewed.   Urgent Care Course     Procedures (including critical care time)  Labs Review Labs Reviewed - No data to display  Imaging Review [place] Venous Img Lower Unilateral  Left  Result Date: 04/27/2016 CLINICAL DATA:  Palpable lump along varicose vein in left calf for the past 3 days. Evaluate for DVT. EXAM: LEFT LOWER EXTREMITY VENOUS DOPPLER ULTRASOUND TECHNIQUE: Gray-scale sonography with graded compression, as well as color Doppler and duplex ultrasound were performed to evaluate the lower extremity deep venous systems from the level of the common femoral vein and including the common femoral, femoral, profunda femoral, popliteal and calf veins including the posterior tibial, peroneal and gastrocnemius veins when visible. The superficial great saphenous vein was also interrogated. Spectral Doppler was utilized to evaluate flow at rest and with distal augmentation maneuvers in the common femoral, femoral and popliteal veins. COMPARISON:  None. FINDINGS: Contralateral Common Femoral Vein: Respiratory phasicity is normal and symmetric with the symptomatic side. No evidence of thrombus. Normal compressibility. Common Femoral Vein: No evidence of thrombus. Normal compressibility, respiratory phasicity and response to augmentation. Saphenofemoral Junction: No evidence of thrombus. Normal compressibility and flow on color Doppler imaging. Profunda Femoral Vein: No evidence of thrombus. Normal compressibility and flow on color Doppler imaging. Femoral Vein: No evidence of thrombus. Normal compressibility, respiratory phasicity and response to augmentation. Popliteal Vein: No evidence of thrombus. Normal compressibility, respiratory phasicity and response to augmentation. Calf Veins: No evidence of thrombus. Normal compressibility and flow on color Doppler imaging. Superficial Great Saphenous Vein: There is mixed echogenic occlusive thrombus within the left greater saphenous vein at the level the superior aspect the left calf (image 27). The greater saphenous vein appears patent proximally as well as distally at the level of the ankle (image 26). Other Findings: The patient's palpable  area of concern correlates with a superficial varicosity which contains mixed echogenic occlusive thrombus (representative images 30 through 36). The superficial varicosity appears to be at least partially contributed to by the greater saphenous vein. IMPRESSION: 1. No evidence of DVT within the left lower extremity. 2. Examination is positive for age-indeterminate occlusive superficial thrombophlebitis involving a prominent superficial varicosity within the medial aspect of the left calf extending to involve a short segment of the greater saphenous vein at the level the superior aspect of the left calf. Again, there is no extension of this superficial varicosity to the deep venous system of the left lower extremity. Electronically Signed   By: Simonne ComeJohn  Watts M.D.   On: 04/27/2016 10:49      MDM   1. Thrombophlebitis of superficial veins of left lower extremity   2. Pain and swelling of left lower leg    Hx and exam c/w superficial thrombophlebitis involving superficial varicosity within medial aspect of Left calf extending into short segment of greater saphenous vein but NO extension to deep venous system.  Encouraged continued elevation, heat, and recommend she try compression stockings as well as taking NSAIDs for 1 week. f/u with PCP or Sports Medicine  if not improving in 1 week. Pt info packet provided.     Junius Finner, PA-C 04/27/16 1124

## 2016-04-29 ENCOUNTER — Telehealth: Payer: Self-pay | Admitting: Emergency Medicine

## 2016-04-29 NOTE — Telephone Encounter (Signed)
Pt states that the knot has gone down considerably it's still tender to touch but much better feeling.  Pt will f/u w/PCP.  TMartin,CMA

## 2016-11-04 ENCOUNTER — Other Ambulatory Visit: Payer: Self-pay | Admitting: Obstetrics and Gynecology

## 2016-11-04 HISTORY — PX: OTHER SURGICAL HISTORY: SHX169

## 2016-12-01 ENCOUNTER — Encounter: Payer: Self-pay | Admitting: Vascular Surgery

## 2016-12-01 ENCOUNTER — Ambulatory Visit (INDEPENDENT_AMBULATORY_CARE_PROVIDER_SITE_OTHER): Payer: BLUE CROSS/BLUE SHIELD | Admitting: Vascular Surgery

## 2016-12-01 VITALS — BP 110/70 | HR 102 | Resp 18 | Ht 64.0 in | Wt 157.3 lb

## 2016-12-01 DIAGNOSIS — I83892 Varicose veins of left lower extremities with other complications: Secondary | ICD-10-CM | POA: Diagnosis not present

## 2016-12-01 NOTE — Progress Notes (Signed)
[facility]  Patient name: Veronica Wu MRN: [id number] DOB: 17-Apr-1975 Sex: female  REASON FOR CONSULT: Valuation of painful left leg venous varicosities  HPI: Veronica Wu is a 41 y.o. female, who is here today for evaluation of left leg venous varicosities.  She is a very pleasant healthy 41 year old.  She had a history of varicosities in her left leg beginning with her second pregnancy.  Become more extensive throughout her thigh and medial left calf.  She had an episode in April 2018 with extensive superficial thrombophlebitis in the medial calf varices.  Duplex at that time showed no DVT but did show some extension into the saphenous vein at the calf level.  She treated this conservatively and eventually had resolution she continues to have pain with prolonged standing and walking particularly over the varicosities but also an achy sensation at the end of day.  She had seen an outlying vein center and has been in the graduated compression garments for several months.  She continues to have pain despite this and also despite elevation and nonsteroidal anti-inflammatory drugs.  With dilatation.  Also had a dilatation and reflux and an anterior accessory great saphenous vein.  Recommendation is been ablation of both of these and also multiple sessions for chemical ablation of her tributary varicosities.  She is seeing [place] for another opinion.  She has had no history of DVT.  Past Medical History:  Diagnosis Date  . Heart murmur   . PONV (postoperative nausea and vomiting)     Family History  Problem Relation Age of Onset  . Diabetes Father   . Heart disease Father     SOCIAL HISTORY: Social History   Social History  . Marital status: Married    Spouse name: N/A  . Number of children: N/A  . Years of education: N/A   Occupational History  . Not on file.   Social History Main Topics  . Smoking status: Never Smoker  .  Smokeless tobacco: Never Used  . Alcohol use No  . Drug use: No  . Sexual activity: Yes    Partners: Male    Birth control/ protection: Surgical     Comment: BTL    Other Topics Concern  . Not on file   Social History Narrative  . No narrative on file    Allergies  Allergen Reactions  . Penicillins Shortness Of Breath    Breathing trouble, bad rash Pt. Can take Keflex  . Erythromycin Nausea And Vomiting    Projectile vomiting  . Sulfa Antibiotics     Cannot remember rxn  . Clindamycin/Lincomycin Rash  . Floxin [Ofloxacin] Rash    Current Outpatient Prescriptions  Medication Sig Dispense Refill  . drospirenone-ethinyl estradiol (YAZ,GIANVI,LORYNA) 3-0.02 MG tablet Take 1 tablet by mouth daily. (Patient not taking: Reported on 12/01/2016) 3 Package 4  . folic acid (FOLVITE) 1 MG tablet Take 1 mg by mouth daily.      . Omega-3 Fatty Acids (OMEGA 3 PO) Take 1 capsule by mouth daily. Pt takes Nutralite product    . prenatal vitamin w/FE, FA (PRENATAL 1 + 1) 27-1 MG TABS Take 1 tablet by mouth daily. Pt takes a product from [employer].     . valACYclovir (VALTREX) 500 MG tablet Take 1 tablet (500 mg total) by mouth 2 (two) times daily. 90 tablet 3   No current facility-administered medications for this visit.     REVIEW OF SYSTEMS:  [X]  denotes positive  finding, [ ]  denotes negative finding Cardiac  Comments:  Chest pain or chest pressure:    Shortness of breath upon exertion:    Short of breath when lying flat:    Irregular heart rhythm:        Vascular    Pain in calf, thigh, or hip brought on by ambulation: x  due to varicose veins  Pain in feet at night that wakes you up from your sleep:     Blood clot in your veins:    Leg swelling:         Pulmonary    Oxygen at home:    Productive cough:     Wheezing:         Neurologic    Sudden weakness in arms or legs:     Sudden numbness in arms or legs:     Sudden onset of difficulty speaking or slurred speech:      Temporary loss of vision in one eye:     Problems with dizziness:         Gastrointestinal    Blood in stool:     Vomited blood:         Genitourinary    Burning when urinating:     Blood in urine:        Psychiatric    Major depression:         Hematologic    Bleeding problems:    Problems with blood clotting too easily:        Skin    Rashes or ulcers:        Constitutional    Fever or chills:      PHYSICAL EXAM: Vitals:   12/01/16 1007  BP: 110/70  Pulse: (!) 102  Resp: 18  SpO2: 98%  Weight: 157 lb 4.8 oz (71.4 kg)  Height: 5\' 4"  (1.626 m)    GENERAL: The patient is a well-nourished female, in no acute distress. The vital signs are documented above. CARDIOVASCULAR: 2+ radial and 2+ dorsalis pedis pulses bilaterally PULMONARY: There is good air exchange  ABDOMEN: Soft and non-tender  MUSCULOSKELETAL: There are no major deformities or cyanosis. NEUROLOGIC: No focal weakness or paresthesias are detected. SKIN: There are no ulcers or rashes noted.  Large varicosities over the left anterior thigh and also medial thigh and medial calf PSYCHIATRIC: The patient has a normal affect.  DATA:  Prior duplex reveals reflux and dilatation in her left great saphenous vein and left anterior great saphenous vein.  MEDICAL ISSUES: Failed conservative treatment of venous reflux.  Had long discussion with the patient regarding options for therapy.  I would recommend laser ablation of her left great saphenous vein from the level of the knee to just below the saphenofemoral junction.  Would recommend stab phlebectomy of multiple tributary varicosities and explained my preference of this over chemical ablation.  Also discussed the former recommendation of ablation of her anterior accessory branch of her great saphenous vein.  She is currently not having any varicosities associated with or arising from this.  I explained that she could possibly develop these in the future but I would  not recommend ablation of this at this time since it is not causing her any difficulty.  She is comfortable with this discussion and wishes to proceed as soon as possible.  She understands this is a outpatient procedure under straight local anesthesia.  Also discussed slight risk of DVT associated with the procedure.   Larina Earthlyodd F. Maysin Carstens, MD  FACS [facility] Tel [phone] Pager [phone]

## 2016-12-02 ENCOUNTER — Other Ambulatory Visit: Payer: Self-pay | Admitting: *Deleted

## 2016-12-02 DIAGNOSIS — I83812 Varicose veins of left lower extremities with pain: Secondary | ICD-10-CM

## 2017-01-17 ENCOUNTER — Encounter: Payer: Self-pay | Admitting: Physician Assistant

## 2017-01-17 ENCOUNTER — Ambulatory Visit (INDEPENDENT_AMBULATORY_CARE_PROVIDER_SITE_OTHER): Payer: BLUE CROSS/BLUE SHIELD | Admitting: Physician Assistant

## 2017-01-17 VITALS — BP 117/70 | HR 75 | Temp 98.4°F | Ht 67.0 in | Wt 158.0 lb

## 2017-01-17 DIAGNOSIS — Z6379 Other stressful life events affecting family and household: Secondary | ICD-10-CM

## 2017-01-17 DIAGNOSIS — Z23 Encounter for immunization: Secondary | ICD-10-CM

## 2017-01-17 DIAGNOSIS — R635 Abnormal weight gain: Secondary | ICD-10-CM

## 2017-01-17 DIAGNOSIS — Z13220 Encounter for screening for lipoid disorders: Secondary | ICD-10-CM | POA: Diagnosis not present

## 2017-01-17 DIAGNOSIS — Z131 Encounter for screening for diabetes mellitus: Secondary | ICD-10-CM | POA: Diagnosis not present

## 2017-01-17 DIAGNOSIS — L720 Epidermal cyst: Secondary | ICD-10-CM

## 2017-01-17 DIAGNOSIS — F439 Reaction to severe stress, unspecified: Secondary | ICD-10-CM

## 2017-01-17 DIAGNOSIS — L821 Other seborrheic keratosis: Secondary | ICD-10-CM

## 2017-01-17 DIAGNOSIS — I82812 Embolism and thrombosis of superficial veins of left lower extremities: Secondary | ICD-10-CM | POA: Diagnosis not present

## 2017-01-17 MED ORDER — BUPROPION HCL ER (XL) 150 MG PO TB24: 150.0000 mg | ORAL_TABLET | Freq: Every day | ORAL | 2 refills | Status: DC

## 2017-01-17 NOTE — Patient Instructions (Signed)
Epidermal Cyst An epidermal cyst is sometimes called an epidermal inclusion cyst or an infundibular cyst. It is a sac made of skin tissue. The sac contains a substance called keratin. Keratin is a protein that is normally secreted through the hair follicles. When keratin becomes trapped in the top layer of skin (epidermis), it can form an epidermal cyst. Epidermal cysts are usually found on the face, neck, trunk, and genitals. These cysts are usually harmless (benign), and they may not cause symptoms unless they become infected. It is important not to pop epidermal cysts yourself. What are the causes? This condition may be caused by:  A blocked hair follicle.  A hair that curls and re-enters the skin instead of growing straight out of the skin (ingrown hair).  A blocked pore.  Irritated skin.  An injury to the skin.  Certain conditions that are passed along from parent to child (inherited).  Human papillomavirus (HPV).  What increases the risk? The following factors may make you more likely to develop an epidermal cyst:  Having acne.  Being overweight.  Wearing tight clothing.  What are the signs or symptoms? The only symptom of this condition may be a small, painless lump underneath the skin. When an epidermal cyst becomes infected, symptoms may include:  Redness.  Inflammation.  Tenderness.  Warmth.  Fever.  Keratin draining from the cyst. Keratin may look like a grayish-white, bad-smelling substance.  Pus draining from the cyst.  How is this diagnosed? This condition is diagnosed with a physical exam. In some cases, you may have a sample of tissue (biopsy) taken from your cyst to be examined under a microscope or tested for bacteria. You may be referred to a health care provider who specializes in skin care (dermatologist). How is this treated? In many cases, epidermal cysts go away on their own without treatment. If a cyst becomes infected, treatment may  include:  Opening and draining the cyst. After draining, minor surgery to remove the rest of the cyst may be done.  Antibiotic medicine to help prevent infection.  Injections of medicines (steroids) that help to reduce inflammation.  Surgery to remove the cyst. Surgery may be done if: ? The cyst becomes large. ? The cyst bothers you. ? There is a chance that the cyst could turn into cancer.  Follow these instructions at home:  Take over-the-counter and prescription medicines only as told by your health care provider.  If you were prescribed an antibiotic, use it as told by your health care provider. Do not stop using the antibiotic even if you start to feel better.  Keep the area around your cyst clean and dry.  Wear loose, dry clothing.  Do not try to pop your cyst.  Avoid touching your cyst.  Check your cyst every day for signs of infection.  Keep all follow-up visits as told by your health care provider. This is important. How is this prevented?  Wear clean, dry, clothing.  Avoid wearing tight clothing.  Keep your skin clean and dry. Shower or take baths every day.  Wash your body with a benzoyl peroxide wash when you shower or bathe. Contact a health care provider if:  Your cyst develops symptoms of infection.  Your condition is not improving or is getting worse.  You develop a cyst that looks different from other cysts you have had.  You have a fever. Get help right away if:  Redness spreads from the cyst into the surrounding area. This information is   not intended to replace advice given to you by your health care provider. Make sure you discuss any questions you have with your health care provider. Document Released: 12/19/2003 Document Revised: 09/16/2015 Document Reviewed: 11/19/2014 Elsevier Interactive Patient Education  2018 [employer]vier Inc[employer]  

## 2017-01-17 NOTE — Progress Notes (Addendum)
Subjective:    Patient ID: Veronica Wu, female    DOB: 07-16-1975, 41 y.o.   MRN: [id number]  HPI Patient is a 41 y/o female who presents to establish care. Her mother and father in-law are patients of mine. 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  Weight - Patient feels that she is overweight and reports being the heaviest she has ever been. She states she has been steadily gaining weight since she turned 41 y/o. She reports no changes in diet but admits to stress eating. She does not exercise frequently. She would like her thyroid levels checked today.  Superficial blood clots - Family history of heart disease, HTN, hyperlipidemia, MI and DM. Patient reports heart murmur that she has been cleared by cardiologist. She also reports history of superficial blood clots in her right leg for which she is having surgery on Thursday. 2 years prior she reports suspected blood clot that she did not seek medical attention for. She reports that the vascular surgeon did not think she needed to be on anticoagulants, however advised her to stop her birth control. Denies swelling.  Stress - She reports brain fogginess, feels overwhelmed, and emotional lability reporting that she "cries at the drop of a hat". Reports her husband likes a lot of attention and works from home so she does not have a lot of time to herself. She reports that one of her sons has Duchenne muscular dystrophy and consequently has a lot of medical problems which can be overwhelming. He recently broke his leg falling which has added to her stress. Denies depressed mood or SI/HI.  Skin - Patient reports recent appearance of a skin tag on her back that she initially thought was a pimple because it was slightly painful and a raised bump. She wanted it evaluated because it has not gone away. Denies itching. She also reports brown, raised skin lesion on her ribs underneath her left breast.  .. Active Ambulatory Problems    Diagnosis Date Noted  .  Multiple allergies 11/03/2010  . History of blood clots 02/01/2016  . Stress due to illness of family member 01/18/2017  . Epidermal cyst 01/18/2017  . Seborrheic keratoses 01/18/2017  . Abnormal weight gain 01/18/2017  . Stress 01/18/2017   Resolved Ambulatory Problems    Diagnosis Date Noted  . Normal pregnancy 11/03/2010  . Previous cesarean section 11/03/2010  . AMA (advanced maternal age) multigravida 35+ 11/03/2010  . Hx of preterm delivery, currently pregnant 11/03/2010  . S/P repeat low transverse C-section 11/05/2010   Past Medical History:  Diagnosis Date  . Heart murmur   . History of blood clots 2018  . PONV (postoperative nausea and vomiting)      Review of Systems See HPI, all other review of systems are negative    Objective:   Physical Exam  Constitutional: She is oriented to person, place, and time. She appears well-developed and well-nourished.  HENT:  Head: Normocephalic and atraumatic.  Right Ear: External ear normal.  Left Ear: External ear normal.  Eyes: Conjunctivae and EOM are normal. Pupils are equal, round, and reactive to light. Right eye exhibits no discharge. Left eye exhibits no discharge. No scleral icterus.  Neck: Normal range of motion. Neck supple. No thyromegaly present.  Cardiovascular: Normal rate and regular rhythm.  Pulmonary/Chest: Effort normal and breath sounds normal.  Lymphadenopathy:    She has no cervical adenopathy.  Neurological: She is alert and oriented to person, place, and time.  She has normal reflexes.  Skin: Skin is warm and dry.  Back: 4 mm in diameter vesicular lesion with darker central area similar to an epidermoid cyst. Chest: 1 cm in diameter raised dark brown plaque characteristic of seborrheic keratosis.  Psychiatric: She has a normal mood and affect. Her behavior is normal. Thought content normal.  Vitals reviewed.     Assessment & Plan:  .Marland Kitchen.Marland Kitchen.[place] was seen today for establish care.  Diagnoses and all  orders for this visit:  Abnormal weight gain -     TSH -     T4, free -     Vitamin D 1,25 dihydroxy -     buPROPion (WELLBUTRIN XL) 150 MG 24 hr tablet; Take 1 tablet (150 mg total) by mouth daily.  Superficial thrombosis of left lower extremity  Influenza vaccine needed -     Flu Vaccine QUAD 6+ mos PF IM (Fluarix Quad PF)  Stress -     buPROPion (WELLBUTRIN XL) 150 MG 24 hr tablet; Take 1 tablet (150 mg total) by mouth daily.  Screening for diabetes mellitus -     COMPLETE METABOLIC PANEL WITH GFR  Screening for lipid disorders -     Lipid Panel w/reflex Direct LDL  Stress due to illness of family member -     buPROPion (WELLBUTRIN XL) 150 MG 24 hr tablet; Take 1 tablet (150 mg total) by mouth daily.  Seborrheic keratoses  Epidermal cyst  .. Depression screen PHQ 2/9 01/17/2017  Down, Depressed, Hopeless 1  PHQ - 2 Score 1  Altered sleeping 1  Tired, decreased energy 2  Feeling bad or failure about yourself  0  Trouble concentrating 0  Moving slowly or fidgety/restless 0  Suicidal thoughts 0  PHQ-9 Score 4   .. GAD 7 : Generalized Anxiety Score 01/17/2017  Nervous, Anxious, on Edge 2  Control/stop worrying 1  Worry too much - different things 2  Trouble relaxing 2  Restless 0  Easily annoyed or irritable 2  Afraid - awful might happen 0  Total GAD 7 Score 9     Situational depression and stress - Discussed Wellbutrin to help with mood and weight loss. Patient would like to try Wellbutrin. Discussed risks and benefits of Wellbutrin and that if she fails this class there are other options to try. Advised patient that it can take 4-6 weeks to see effect of medication.  Weight gain - Thyroid and vitamin D levels to assess for secondary causes of her weight gain. Start Wellbutrin. Discussed diet and exercise. Discussed joining an exercise group for social and physical benefits  Skin lesion on back - Possible epidermoid cyst on upper back at the base of her  neck. Advised patient to apply warm compresses to the area. Discussed excision if it persists or if she would like it evaluated for other cause. -under left breast was seb keratosis. Discussed benign nature.   History of blood clots - Patient reports history of superficial blood clots for which she is being followed by a vascular surgeon. She is schedule for surgery on Thursday for her most recent clot.  Routine screening - Fasting lipid panel and CMP today to screen for hyperlipidemia, liver and kidney function, and DM.  Marland Kitchen..Spent 30 minutes with patient and greater than 50 percent of visit spent counseling patient regarding treatment plan.

## 2017-01-18 ENCOUNTER — Encounter: Payer: Self-pay | Admitting: Physician Assistant

## 2017-01-18 DIAGNOSIS — L720 Epidermal cyst: Secondary | ICD-10-CM | POA: Insufficient documentation

## 2017-01-18 DIAGNOSIS — R635 Abnormal weight gain: Secondary | ICD-10-CM | POA: Insufficient documentation

## 2017-01-18 DIAGNOSIS — F439 Reaction to severe stress, unspecified: Secondary | ICD-10-CM | POA: Insufficient documentation

## 2017-01-18 DIAGNOSIS — Z6379 Other stressful life events affecting family and household: Secondary | ICD-10-CM | POA: Insufficient documentation

## 2017-01-18 DIAGNOSIS — L821 Other seborrheic keratosis: Secondary | ICD-10-CM | POA: Insufficient documentation

## 2017-01-19 ENCOUNTER — Encounter: Payer: Self-pay | Admitting: Vascular Surgery

## 2017-01-19 ENCOUNTER — Other Ambulatory Visit: Payer: Self-pay | Admitting: *Deleted

## 2017-01-19 ENCOUNTER — Other Ambulatory Visit: Payer: Self-pay

## 2017-01-19 ENCOUNTER — Ambulatory Visit (INDEPENDENT_AMBULATORY_CARE_PROVIDER_SITE_OTHER): Payer: BLUE CROSS/BLUE SHIELD | Admitting: Vascular Surgery

## 2017-01-19 VITALS — BP 112/71 | HR 100 | Temp 97.6°F | Resp 16 | Ht 67.0 in | Wt 156.0 lb

## 2017-01-19 DIAGNOSIS — I868 Varicose veins of other specified sites: Secondary | ICD-10-CM

## 2017-01-19 DIAGNOSIS — I83892 Varicose veins of left lower extremities with other complications: Secondary | ICD-10-CM

## 2017-01-19 MED ORDER — IBUPROFEN 600 MG PO TABS: 600.0000 mg | ORAL_TABLET | Freq: Three times a day (TID) | ORAL | 0 refills | Status: DC | PRN

## 2017-01-19 NOTE — Progress Notes (Signed)
     Laser Ablation Procedure    Date: 01/19/2017   Veronica Wu DOB:Jul 27, 1975  Consent signed: Yes    Surgeon:  Dr. Tawanna Coolerodd Shekira Drummer  Procedure: Laser Ablation: left Greater Saphenous Vein  BP 112/71   Pulse 100   Temp 97.6 F (36.4 C)   Resp 16   Ht 5\' 7"  (1.702 m)   Wt 156 lb (70.8 kg)   SpO2 99%   BMI 24.43 kg/m   Tumescent Anesthesia: 480 cc 0.9% NaCl with 50 cc Lidocaine HCL with 1% Epi and 15 cc 8.4% NaHCO3  Local Anesthesia: 3.5 cc Lidocaine HCL and NaHCO3 (ratio 2:1)  15 watts continuous mode        Total energy: 2374   Total time: 2:97    Stab Phlebectomy: 10-20 Sites: Thigh and Calf  Patient tolerated procedure well  Notes:   Description of Procedure:  After marking the course of the secondary varicosities, the patient was placed on the operating table in the supine position, and the left leg was prepped and draped in sterile fashion.   Local anesthetic was administered and under ultrasound guidance the saphenous vein was accessed with a micro needle and guide wire; then the mirco puncture sheath was placed.  A guide wire was inserted saphenofemoral junction , followed by a 5 french sheath.  The position of the sheath and then the laser fiber below the junction was confirmed using the ultrasound.  Tumescent anesthesia was administered along the course of the saphenous vein using ultrasound guidance. The patient was placed in Trendelenburg position and protective laser glasses were placed on patient and staff, and the laser was fired at 15 watts continuous mode advancing 1-282mm/second for a total of 2374 joules.   For stab phlebectomies, local anesthetic was administered at the previously marked varicosities, and tumescent anesthesia was administered around the vessels.  Ten to 20 stab wounds were made using the tip of an 11 blade. And using the vein hook, the phlebectomies were performed using a hemostat to avulse the varicosities.  Adequate hemostasis was achieved.      Steri strips were applied to the stab wounds and ABD pads and thigh high compression stockings were applied.  Ace wrap bandages were applied over the phlebectomy sites and at the top of the saphenofemoral junction. Blood loss was less than 15 cc.  The patient ambulated out of the operating room having tolerated the procedure well.  Uneventful ablation of great saphenous vein from just below the knee.  To just below the saphenofemoral junction.  Stab phlebectomies of tributary varicosities in the thigh medial and posterior calf.  Follow up in 1 week

## 2017-01-20 LAB — LIPID PANEL W/REFLEX DIRECT LDL
CHOLESTEROL: 193 mg/dL (ref <200)
HDL: 58 mg/dL (ref >50)
LDL Cholesterol (Calc): 120 mg/dL (calc) — ABNORMAL HIGH
Non-HDL Cholesterol (Calc): 135 mg/dL (calc) — ABNORMAL HIGH (ref <130)
Total CHOL/HDL Ratio: 3.3 (calc) (ref <5.0)
Triglycerides: 62 mg/dL (ref <150)

## 2017-01-20 LAB — COMPLETE METABOLIC PANEL WITH GFR
AG Ratio: 1.7 (calc) (ref 1.0–2.5)
ALT: 22 U/L (ref 6–29)
AST: 16 U/L (ref 10–30)
Albumin: 4.3 g/dL (ref 3.6–5.1)
Alkaline phosphatase (APISO): 62 U/L (ref 33–115)
BUN: 15 mg/dL (ref 7–25)
CO2: 29 mmol/L (ref 20–32)
Calcium: 9.6 mg/dL (ref 8.6–10.2)
Chloride: 101 mmol/L (ref 98–110)
Creat: 0.82 mg/dL (ref 0.50–1.10)
GFR, EST AFRICAN AMERICAN: 102 mL/min/{1.73_m2} (ref >=60)
GFR, EST NON AFRICAN AMERICAN: 88 mL/min/{1.73_m2} (ref >=60)
GLOBULIN: 2.5 g/dL (ref 1.9–3.7)
Glucose, Bld: 99 mg/dL (ref 65–99)
POTASSIUM: 4.3 mmol/L (ref 3.5–5.3)
SODIUM: 138 mmol/L (ref 135–146)
Total Bilirubin: 0.7 mg/dL (ref 0.2–1.2)
Total Protein: 6.8 g/dL (ref 6.1–8.1)

## 2017-01-20 LAB — T4, FREE: Free T4: 1.2 ng/dL (ref 0.8–1.8)

## 2017-01-20 LAB — VITAMIN D 1,25 DIHYDROXY
VITAMIN D3 1, 25 (OH): 51 pg/mL
Vitamin D 1, 25 (OH)2 Total: 51 pg/mL (ref 18–72)
Vitamin D2 1, 25 (OH)2: <8 pg/mL

## 2017-01-20 LAB — TSH: TSH: 4.22 mIU/L

## 2017-01-26 ENCOUNTER — Encounter: Payer: Self-pay | Admitting: Vascular Surgery

## 2017-01-26 ENCOUNTER — Ambulatory Visit (INDEPENDENT_AMBULATORY_CARE_PROVIDER_SITE_OTHER): Payer: Self-pay | Admitting: Vascular Surgery

## 2017-01-26 ENCOUNTER — Other Ambulatory Visit: Payer: Self-pay

## 2017-01-26 ENCOUNTER — Ambulatory Visit (HOSPITAL_COMMUNITY)
Admission: RE | Admit: 2017-01-26 | Discharge: 2017-01-26 | Disposition: A | Discharge status: Home / Self Care | Payer: BLUE CROSS/BLUE SHIELD | Source: Ambulatory Visit | Attending: Vascular Surgery | Admitting: Vascular Surgery

## 2017-01-26 VITALS — BP 112/73 | HR 94 | Temp 99.4°F | Resp 16 | Ht 67.0 in | Wt 156.0 lb

## 2017-01-26 DIAGNOSIS — I83812 Varicose veins of left lower extremities with pain: Secondary | ICD-10-CM | POA: Insufficient documentation

## 2017-01-26 DIAGNOSIS — I83892 Varicose veins of left lower extremities with other complications: Secondary | ICD-10-CM

## 2017-01-26 NOTE — Progress Notes (Signed)
Subjective:     Patient ID: Veronica Wu, female   DOB: 1976-01-17, 41 y.o.   MRN: [id number]  HPI This 41 year old female returns 1 week post-laser ablation left great saphenous vein with multiple stab phlebectomy of painful varicosities performed by Dr. early. She has had a moderate amount of discomfort in the medial thigh area down to the knee. It is beginning to improved slightly. She has taken ibuprofen as instructed and worn her elastic compression stockings. She has had no edema in the left ankle.  Past Medical History:  Diagnosis Date  . Heart murmur   . History of blood clots 2018  . PONV (postoperative nausea and vomiting)     Social History   Tobacco Use  . Smoking status: Never Smoker  . Smokeless tobacco: Never Used  Substance Use Topics  . Alcohol use: No    Family History  Problem Relation Age of Onset  . Diabetes Father   . Heart disease Father   . Heart attack Father   . Hyperlipidemia Father   . Hypertension Father   . Hyperlipidemia Mother   . Hypertension Mother   . Heart attack Maternal Grandmother   . Hypertension Maternal Grandmother   . Hypertension Maternal Grandfather   . Hypertension Paternal Grandmother   . Heart attack Paternal Grandfather   . Diabetes Paternal Grandfather   . Hyperlipidemia Paternal Grandfather   . Hypertension Paternal Grandfather     Allergies  Allergen Reactions  . Penicillins Shortness Of Breath    Breathing trouble, bad rash Pt. Can take Keflex  . Erythromycin Nausea And Vomiting    Projectile vomiting  . Sulfa Antibiotics     Cannot remember rxn  . Clindamycin/Lincomycin Rash  . Floxin [Ofloxacin] Rash     Current Outpatient Medications:  .  buPROPion (WELLBUTRIN XL) 150 MG 24 hr tablet, Take 1 tablet (150 mg total) by mouth daily., Disp: 30 tablet, Rfl: 2 .  ibuprofen (ADVIL,MOTRIN) 600 MG tablet, Take 1 tablet (600 mg total) by mouth every 8 (eight) hours as needed., Disp: 30 tablet, Rfl: 0 .  Multiple  Vitamin (MULTIVITAMIN) tablet, Take 1 tablet by mouth daily., Disp: , Rfl:  .  valACYclovir (VALTREX) 500 MG tablet, Take 1 tablet (500 mg total) by mouth 2 (two) times daily. (Patient taking differently: Take 500 mg by mouth as needed. ), Disp: 90 tablet, Rfl: 3  Vitals:   01/26/17 1031  BP: 112/73  Pulse: 94  Resp: 16  Temp: 99.4 F (37.4 C)  TempSrc: Oral  SpO2: 100%  Weight: 156 lb (70.8 kg)  Height: 5\' 7"  (1.702 m)    Body mass index is 24.43 kg/m.         Review of Systems Denies chest pain, dyspnea on exertion, PND, orthopnea, hemoptysis    Objective:   Physical Exam BP 112/73 (BP Location: Left Arm, Patient Position: Sitting, Cuff Size: Normal)   Pulse 94   Temp 99.4 F (37.4 C) (Oral)   Resp 16   Ht 5\' 7"  (1.702 m)   Wt 156 lb (70.8 kg)   SpO2 100%   BMI 24.43 kg/m   Gen. well-developed well-nourished female no apparent distress alert and oriented 3 Lungs no rhonchi or wheezing Left leg with mild discomfort to moderate palpation over the great saphenous vein from distal thigh to the inguinal crease. Mild ecchymosis. No distal edema noted. 3+ dorsalis pedis pulse palpable. Stab phlebectomy wounds healing nicely.  Today I ordered a venous duplex exam the left  leg which I reviewed and interpreted also performed a bedside SonoSite ultrasound exam. Left great saphenous vein is totally closed right up to the saphenofemoral junction but no DVT is noted     Assessment:     Access laser ablation left great saphenous vein with multiple stab phlebectomy of painful varicosities performed by Dr. early on December 20-excellent early results with some persistent pain    Plan:     Patient return in 3 months for final follow-up by Dr. early

## 2017-02-02 ENCOUNTER — Encounter (HOSPITAL_COMMUNITY): Payer: BLUE CROSS/BLUE SHIELD

## 2017-02-02 ENCOUNTER — Ambulatory Visit: Payer: BLUE CROSS/BLUE SHIELD | Admitting: Vascular Surgery

## 2017-02-28 ENCOUNTER — Ambulatory Visit: Payer: BLUE CROSS/BLUE SHIELD | Admitting: Physician Assistant

## 2017-03-20 ENCOUNTER — Encounter: Payer: Self-pay | Admitting: Physician Assistant

## 2017-03-20 ENCOUNTER — Other Ambulatory Visit: Payer: Self-pay | Admitting: Physician Assistant

## 2017-03-20 DIAGNOSIS — R635 Abnormal weight gain: Secondary | ICD-10-CM

## 2017-03-20 DIAGNOSIS — Z6379 Other stressful life events affecting family and household: Secondary | ICD-10-CM

## 2017-03-20 DIAGNOSIS — F439 Reaction to severe stress, unspecified: Secondary | ICD-10-CM

## 2017-03-21 MED ORDER — BUPROPION HCL ER (XL) 150 MG PO TB24: 150.0000 mg | ORAL_TABLET | Freq: Every day | ORAL | 0 refills | Status: DC

## 2017-03-21 MED ORDER — LEVOTHYROXINE SODIUM 25 MCG PO TABS: 25.0000 ug | ORAL_TABLET | Freq: Every day | ORAL | 1 refills | Status: DC

## 2017-03-27 ENCOUNTER — Ambulatory Visit: Payer: BLUE CROSS/BLUE SHIELD | Admitting: Physician Assistant

## 2017-04-14 ENCOUNTER — Other Ambulatory Visit: Payer: Self-pay | Admitting: *Deleted

## 2017-04-14 MED ORDER — LEVOTHYROXINE SODIUM 25 MCG PO TABS: 25.0000 ug | ORAL_TABLET | Freq: Every day | ORAL | 0 refills | Status: DC

## 2017-04-25 ENCOUNTER — Ambulatory Visit: Payer: BLUE CROSS/BLUE SHIELD | Admitting: Vascular Surgery

## 2017-05-04 IMAGING — US US EXTREM LOW VENOUS*L*
1 series · 13 of 24 positions shown · non-contrast
Comparison: None.

CLINICAL DATA: Palpable lump along varicose vein in left calf for
the past 3 days. Evaluate for DVT.



[Series 1: us extrem low venous*left* · 0.07mm/px · 13 of 35 slices shown]
[im 1/35]
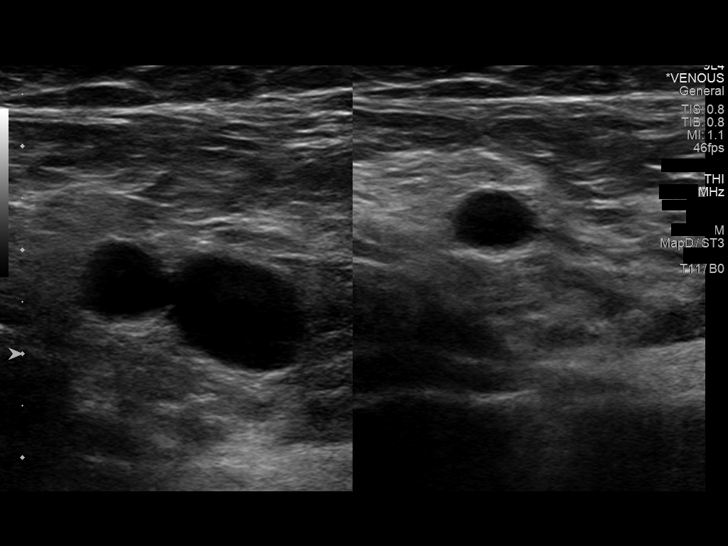
[im 3/35]
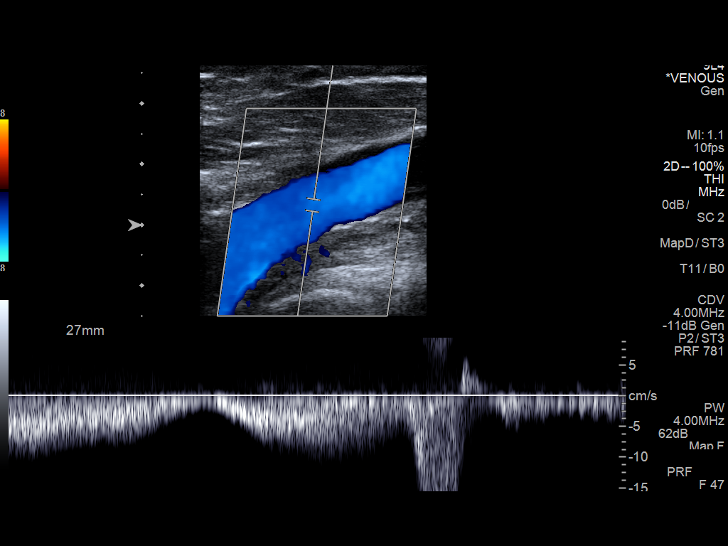
[im 6/35]
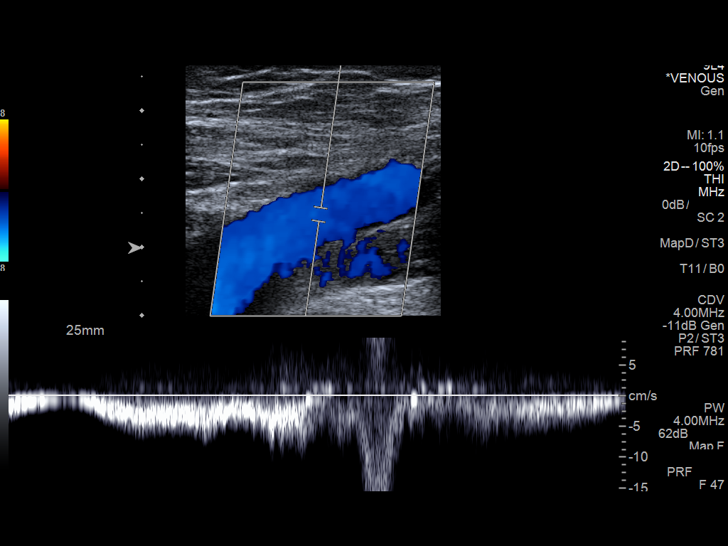
[im 9/35]
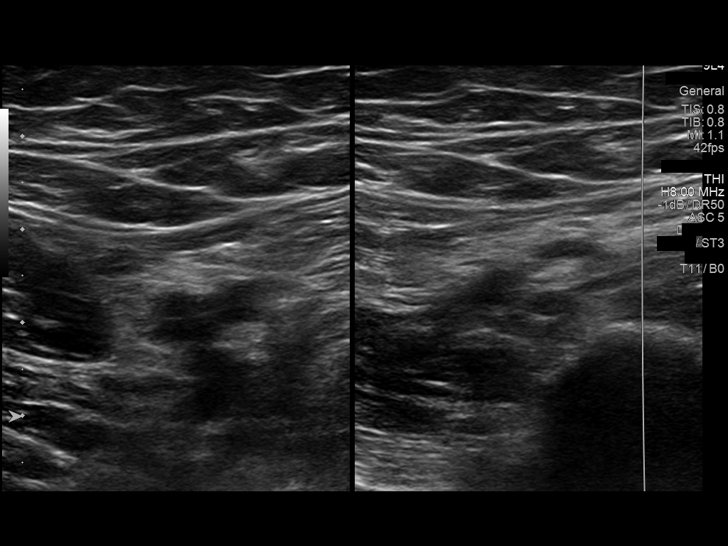
[im 12/35]
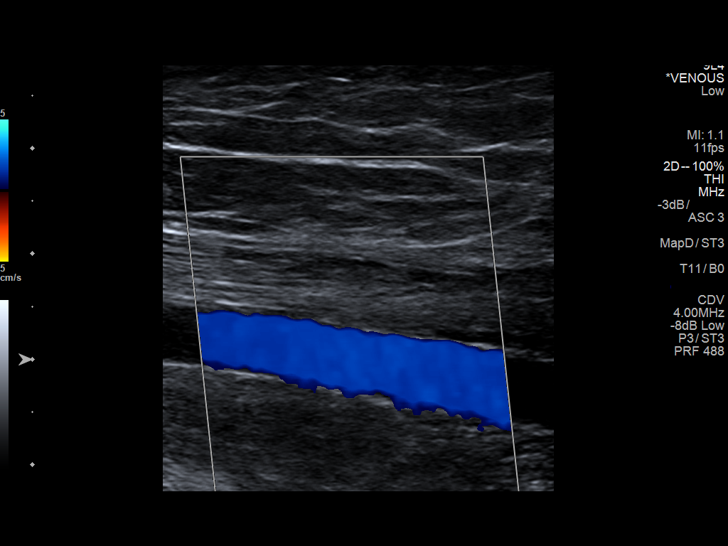
[im 15/35]
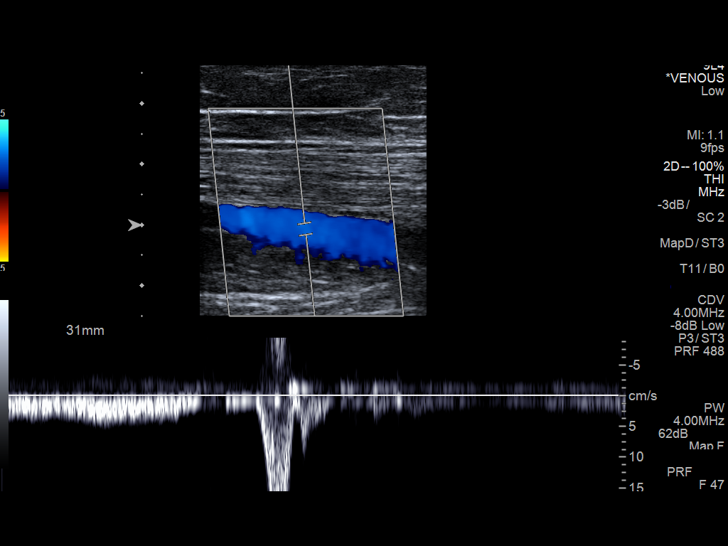
[im 18/35]
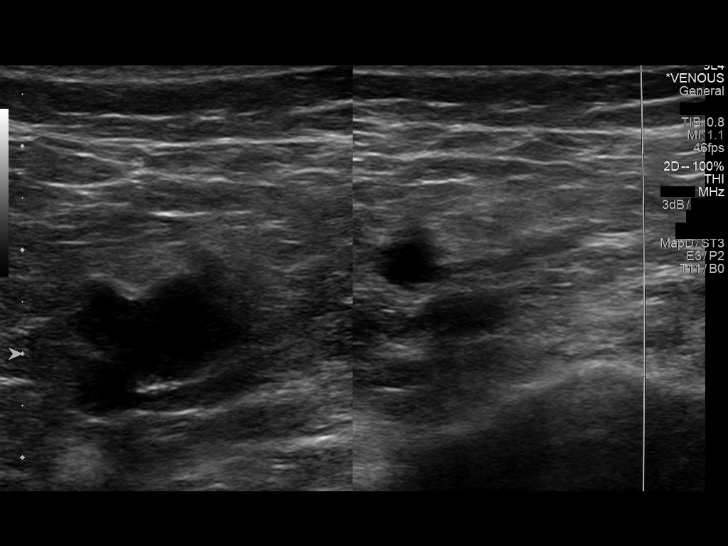
[im 20/35]
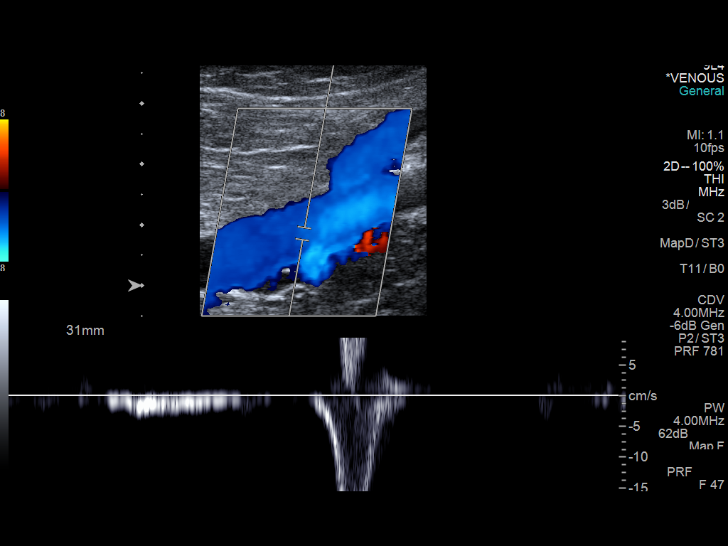
[im 23/35]
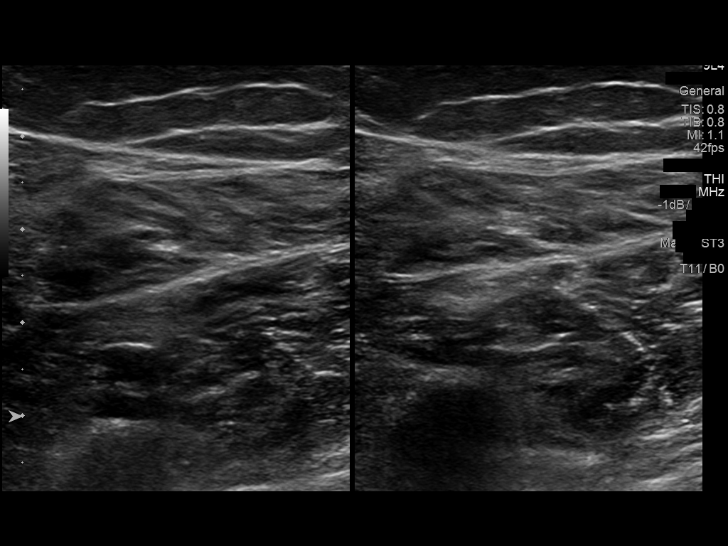
[im 26/35]
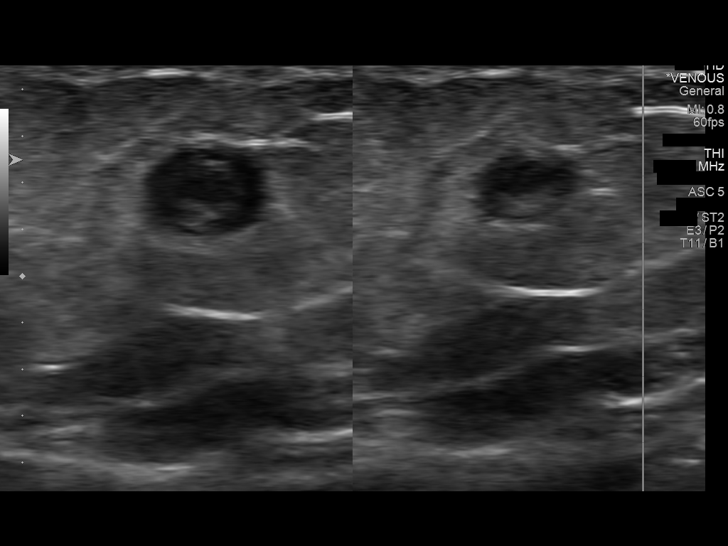
[im 29/35]
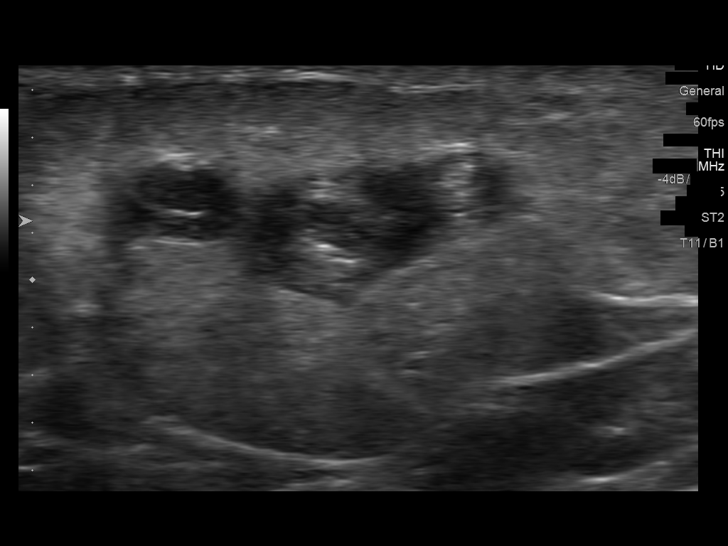
[im 32/35]
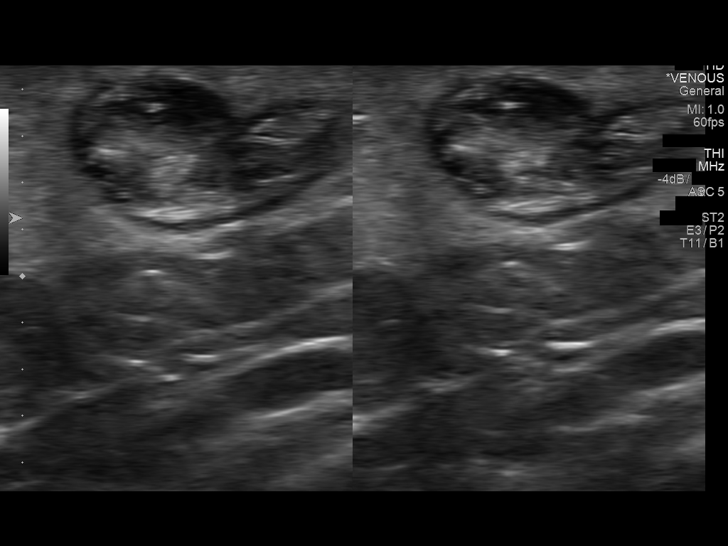
[im 35/35]
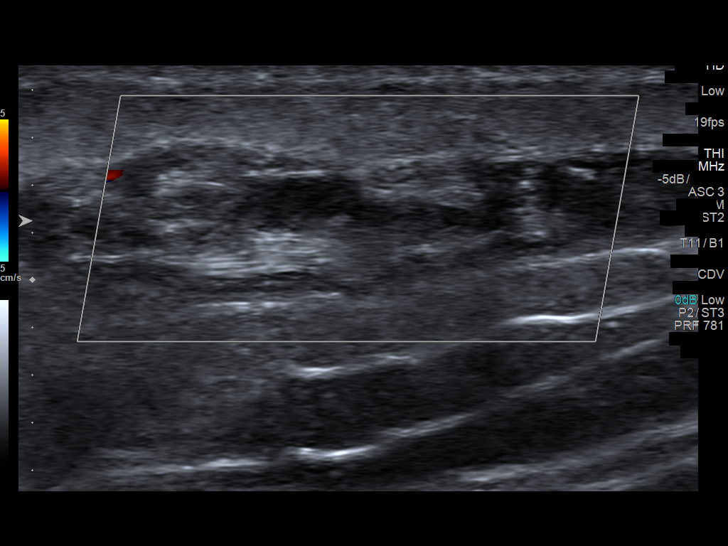

[13 of 24 positions shown; findings below may reference images not displayed]

FINDINGS: Contralateral Common Femoral Vein: Respiratory phasicity is normal
and symmetric with the symptomatic side. No evidence of thrombus.
Normal compressibility.

Common Femoral Vein: No evidence of thrombus. Normal
compressibility, respiratory phasicity and response to augmentation.

Saphenofemoral Junction: No evidence of thrombus. Normal
compressibility and flow on color Doppler imaging.

Profunda Femoral Vein: No evidence of thrombus. Normal
compressibility and flow on color Doppler imaging.

Femoral Vein: No evidence of thrombus. Normal compressibility,
respiratory phasicity and response to augmentation.

Popliteal Vein: No evidence of thrombus. Normal compressibility,
respiratory phasicity and response to augmentation.

Calf Veins: No evidence of thrombus. Normal compressibility and flow
on color Doppler imaging.

Superficial Great Saphenous Vein: There is mixed echogenic occlusive
thrombus within the left greater saphenous vein at the level the
superior aspect the left calf (image 27). The greater saphenous vein
appears patent proximally as well as distally at the level of the
ankle (image 26).

Other Findings: The patient's palpable area of concern correlates
with a superficial varicosity which contains mixed echogenic
occlusive thrombus (representative images 30 through 36). The
superficial varicosity appears to be at least partially contributed
to by the greater saphenous vein.
IMPRESSION: 1. No evidence of DVT within the left lower extremity.
2. Examination is positive for age-indeterminate occlusive
superficial thrombophlebitis involving a prominent superficial
varicosity within the medial aspect of the left calf extending to
involve a short segment of the greater saphenous vein at the level
the superior aspect of the left calf. Again, there is no extension
of this superficial varicosity to the deep venous system of the left
lower extremity.

## 2017-05-08 ENCOUNTER — Ambulatory Visit: Payer: BLUE CROSS/BLUE SHIELD | Admitting: Physician Assistant

## 2017-05-30 ENCOUNTER — Encounter: Payer: Self-pay | Admitting: Physician Assistant

## 2017-05-31 ENCOUNTER — Ambulatory Visit (INDEPENDENT_AMBULATORY_CARE_PROVIDER_SITE_OTHER): Payer: 59 | Admitting: Physician Assistant

## 2017-05-31 ENCOUNTER — Encounter: Payer: Self-pay | Admitting: Physician Assistant

## 2017-05-31 VITALS — BP 109/68 | HR 88 | Ht 67.0 in | Wt 149.0 lb

## 2017-05-31 DIAGNOSIS — R7989 Other specified abnormal findings of blood chemistry: Secondary | ICD-10-CM | POA: Insufficient documentation

## 2017-05-31 DIAGNOSIS — Z8619 Personal history of other infectious and parasitic diseases: Secondary | ICD-10-CM | POA: Insufficient documentation

## 2017-05-31 DIAGNOSIS — Z760 Encounter for issue of repeat prescription: Secondary | ICD-10-CM | POA: Diagnosis not present

## 2017-05-31 DIAGNOSIS — Z6379 Other stressful life events affecting family and household: Secondary | ICD-10-CM

## 2017-05-31 DIAGNOSIS — R011 Cardiac murmur, unspecified: Secondary | ICD-10-CM | POA: Insufficient documentation

## 2017-05-31 LAB — TSH: TSH: 3.17 m[IU]/L

## 2017-05-31 MED ORDER — VALACYCLOVIR HCL 500 MG PO TABS: 500.0000 mg | ORAL_TABLET | Freq: Every day | ORAL | 1 refills | Status: DC

## 2017-05-31 NOTE — Progress Notes (Signed)
Subjective:    Patient ID: Veronica Wu, female    DOB: 1975-08-21, 42 y.o.   MRN: [id number]  HPI Patient is a 42 year old female who presents to the clinic for medication follow-up.  She was started on Wellbutrin 150 mg daily and overall she feels like this is helping with her stress.  She does not find herself crying like she was before.  She denies any suicidal or homicidal thoughts or feelings.  She denies any hallucinations or visual disturbances.  She recently just got back from [place] with the family.  Her son with muscular dystrophy is doing well currently that also helps with the daily struggle of life.  She admits that she is not jumping up in the morning ready to start the day but unaware if this is just wife or if medication can help make better.  She does not notice any real change with levothyroxine. She is taking daily in am. No side effects.   Overall lost 9lbs since last visit. She is exercising and trying not to eat carbs.   .. Active Ambulatory Problems    Diagnosis Date Noted  . Multiple allergies 11/03/2010  . History of blood clots 02/01/2016  . Stress due to illness of family member 01/18/2017  . Epidermal cyst 01/18/2017  . Seborrheic keratoses 01/18/2017  . Abnormal weight gain 01/18/2017  . Stress 01/18/2017  . Varicose veins of left lower extremity with complications 01/26/2017  . Elevated TSH 05/31/2017  . Heart murmur 05/31/2017   Resolved Ambulatory Problems    Diagnosis Date Noted  . Normal pregnancy 11/03/2010  . Previous cesarean section 11/03/2010  . AMA (advanced maternal age) multigravida 35+ 11/03/2010  . Hx of preterm delivery, currently pregnant 11/03/2010  . S/P repeat low transverse C-section 11/05/2010   Past Medical History:  Diagnosis Date  . Heart murmur   . History of blood clots 2018  . PONV (postoperative nausea and vomiting)         Review of Systems  All other systems reviewed and are negative.      Objective:    Physical Exam  Constitutional: She is oriented to person, place, and time. She appears well-developed and well-nourished.  HENT:  Head: Normocephalic and atraumatic.  Neck: Normal range of motion. Neck supple. No thyromegaly present.  Cardiovascular: Normal rate and regular rhythm.  No murmur heard. Pulmonary/Chest: Effort normal and breath sounds normal.  Neurological: She is alert and oriented to person, place, and time.  Psychiatric: She has a normal mood and affect. Her behavior is normal.          Assessment & Plan:  Marland KitchenMarland KitchenAlyson was seen today for depression, anxiety and hypothyroidism.  Diagnoses and all orders for this visit:  Stress due to illness of family member  Medication refill -     valACYclovir (VALTREX) 500 MG tablet; Take 1 tablet (500 mg total) by mouth daily.  Elevated TSH -     TSH  H/O cold sores -     valACYclovir (VALTREX) 500 MG tablet; Take 1 tablet (500 mg total) by mouth daily.    Depression screen [facility] 2/9 05/31/2017 01/17/2017  Decreased Interest 0 -  Down, Depressed, Hopeless 0 1  PHQ - 2 Score 0 1  Altered sleeping 0 1  Tired, decreased energy 1 2  Change in appetite 1 -  Feeling bad or failure about yourself  0 0  Trouble concentrating 0 0  Moving slowly or fidgety/restless 0 0  Suicidal thoughts 0  0  PHQ-9 Score 2 4  Difficult doing work/chores Not difficult at all -   .. GAD 7 : Generalized Anxiety Score 05/31/2017 01/17/2017  Nervous, Anxious, on Edge 2 2  Control/stop worrying 1 1  Worry too much - different things 2 2  Trouble relaxing 1 2  Restless 0 0  Easily annoyed or irritable 1 2  Afraid - awful might happen 0 0  Total GAD 7 Score 7 9  Anxiety Difficulty Not difficult at all -    Discussed increasing wellbutrin. She will try  for a few weeks and let me know which dose to fill. Overall she is doing well with  daily. Continue with exercise and other relaxation techniques.   Refilled valtrex for cold sores.    Will check TSH and adjust medication accordingly.

## 2017-06-01 ENCOUNTER — Other Ambulatory Visit: Payer: Self-pay

## 2017-06-01 ENCOUNTER — Encounter: Payer: Self-pay | Admitting: Physician Assistant

## 2017-06-01 DIAGNOSIS — R7989 Other specified abnormal findings of blood chemistry: Secondary | ICD-10-CM

## 2017-06-01 MED ORDER — LEVOTHYROXINE SODIUM 50 MCG PO TABS: 50.0000 ug | ORAL_TABLET | Freq: Every day | ORAL | 1 refills | Status: DC

## 2017-06-01 NOTE — Progress Notes (Signed)
Call pt: thyroid did improve some but still not in the middle range yet? Do you want to double levothyroxine to  and recheck in 4-6 weeks labs only to see if we can get you more in the middle.

## 2017-06-06 ENCOUNTER — Encounter: Payer: Self-pay | Admitting: Vascular Surgery

## 2017-06-06 ENCOUNTER — Other Ambulatory Visit: Payer: Self-pay

## 2017-06-06 ENCOUNTER — Ambulatory Visit (INDEPENDENT_AMBULATORY_CARE_PROVIDER_SITE_OTHER): Payer: 59 | Admitting: Vascular Surgery

## 2017-06-06 VITALS — BP 122/84 | HR 86 | Temp 98.0°F | Resp 14 | Ht 67.0 in | Wt 148.0 lb

## 2017-06-06 DIAGNOSIS — I83892 Varicose veins of left lower extremities with other complications: Secondary | ICD-10-CM

## 2017-06-06 NOTE — Progress Notes (Signed)
   Patient name: Veronica Wu MRN: [id number] DOB: 1975/10/19 Sex: female  REASON FOR VISIT: Follow-up left leg laser ablation of great saphenous vein and stab phlebectomy of multiple tributary varicosities  HPI: Veronica Wu is a 42 y.o. female here today for follow-up of treatment of venous hypertension laser ablation of her left great saphenous vein on 01/19/2017.  Her 1 week postop duplex showed closure of her great saphenous vein and she was having more than the typical amount of discomfort.  She is here today for final follow-up.  She reports that this discomfort is completely resolved.  She has had a very good result from pain relief standpoint of the varicosities and no swelling.  Current Outpatient Medications  Medication Sig Dispense Refill  . buPROPion (WELLBUTRIN XL) 150 MG 24 hr tablet Take 1 tablet (150 mg total) by mouth daily. 90 tablet 0  . levothyroxine (SYNTHROID, LEVOTHROID) 50 MCG tablet Take 1 tablet (50 mcg total) by mouth daily. 90 tablet 1  . Multiple Vitamin (MULTIVITAMIN) tablet Take 1 tablet by mouth daily.    . valACYclovir (VALTREX) 500 MG tablet Take 1 tablet (500 mg total) by mouth daily. 90 tablet 1   No current facility-administered medications for this visit.      PHYSICAL EXAM: Vitals:   06/06/17 0945  BP: 122/84  Pulse: 86  Resp: 14  Temp: 98 F (36.7 C)  TempSrc: Oral  SpO2: 98%  Weight: 148 lb (67.1 kg)  Height:  (1.702 m)    GENERAL: The patient is a well-nourished female, in no acute distress. The vital signs are documented above. Resolution of the varicosities with healing of the phlebectomy sites.  I did image her great saphenous vein with SonoSite and this is closed  MEDICAL ISSUES: Excellent result from closure of her great saphenous vein and stab phlebectomy of pain relief of her left leg.  She is upset however and that for some reason her coverage was denied.  She has had a very large medical bill  associated with this.  She has discussed this with the insurance company and apparently was told that there was no documentation of significant reflux in her great saphenous vein.  I reviewed her records and she clearly has documentation which was used for preapproval of this procedure.  I reassured her that we will communicate with the insurance company and resolve this issue.  Clementeen Hoof, RN was present during the visit and will discuss this further with the insurance company and provide any additional information as required.  We will communicate with this as soon as we have resolve this issue for her   Larina Earthly, MD [facility] Tel [phone] Pager [phone]

## 2017-06-12 DIAGNOSIS — Z1231 Encounter for screening mammogram for malignant neoplasm of breast: Secondary | ICD-10-CM | POA: Diagnosis not present

## 2017-06-29 ENCOUNTER — Other Ambulatory Visit: Payer: Self-pay | Admitting: Physician Assistant

## 2017-06-29 DIAGNOSIS — R635 Abnormal weight gain: Secondary | ICD-10-CM

## 2017-06-29 DIAGNOSIS — Z6379 Other stressful life events affecting family and household: Secondary | ICD-10-CM

## 2017-06-29 DIAGNOSIS — F439 Reaction to severe stress, unspecified: Secondary | ICD-10-CM

## 2017-07-05 ENCOUNTER — Encounter: Payer: Self-pay | Admitting: Physician Assistant

## 2017-07-05 MED ORDER — BUPROPION HCL ER (XL) 300 MG PO TB24
300.0000 mg | ORAL_TABLET | Freq: Every day | ORAL | 1 refills | Status: DC
Start: 2017-07-05 — End: 2017-12-30

## 2017-09-23 ENCOUNTER — Other Ambulatory Visit: Payer: Self-pay | Admitting: Physician Assistant

## 2017-10-25 ENCOUNTER — Other Ambulatory Visit: Payer: Self-pay

## 2017-10-25 ENCOUNTER — Encounter: Payer: Self-pay | Admitting: Physician Assistant

## 2017-10-25 DIAGNOSIS — R7989 Other specified abnormal findings of blood chemistry: Secondary | ICD-10-CM

## 2017-10-26 DIAGNOSIS — R7989 Other specified abnormal findings of blood chemistry: Secondary | ICD-10-CM | POA: Diagnosis not present

## 2017-10-26 LAB — TSH: TSH: 2.04 mIU/L

## 2017-10-27 NOTE — Progress Notes (Signed)
Call pt: thyroid perfect! Right in the middle. Ok to give 1 year refills.

## 2017-10-31 ENCOUNTER — Encounter: Payer: Self-pay | Admitting: Physician Assistant

## 2017-11-01 MED ORDER — LEVOTHYROXINE SODIUM 50 MCG PO TABS: 50.0000 ug | ORAL_TABLET | Freq: Every day | ORAL | 3 refills | Status: AC

## 2017-12-30 ENCOUNTER — Other Ambulatory Visit: Payer: Self-pay | Admitting: Physician Assistant

## 2018-01-09 IMAGING — MG MM DIAG BREAST TOMO BILATERAL
8 of 12 series · 8 of 28 positions shown · non-contrast
Comparison: 10/30/2014 and earlier

CLINICAL DATA: One year follow-up for left breast fibroadenoma
versus phyllodes. Ultrasound-guided core biopsy performed 03/19/2014
of an oval mass in the 8 o'clock location of the left breast showed
a biphasic epithelial lesion consistent with fibroadenoma versus
phyllodes. Patient was offered six-month follow-up versus excision.
Six-month follow-up was performed.

EXAM:
2D DIGITAL DIAGNOSTIC BILATERAL MAMMOGRAM WITH CAD AND ADJUNCT TOMO

[L MLO]
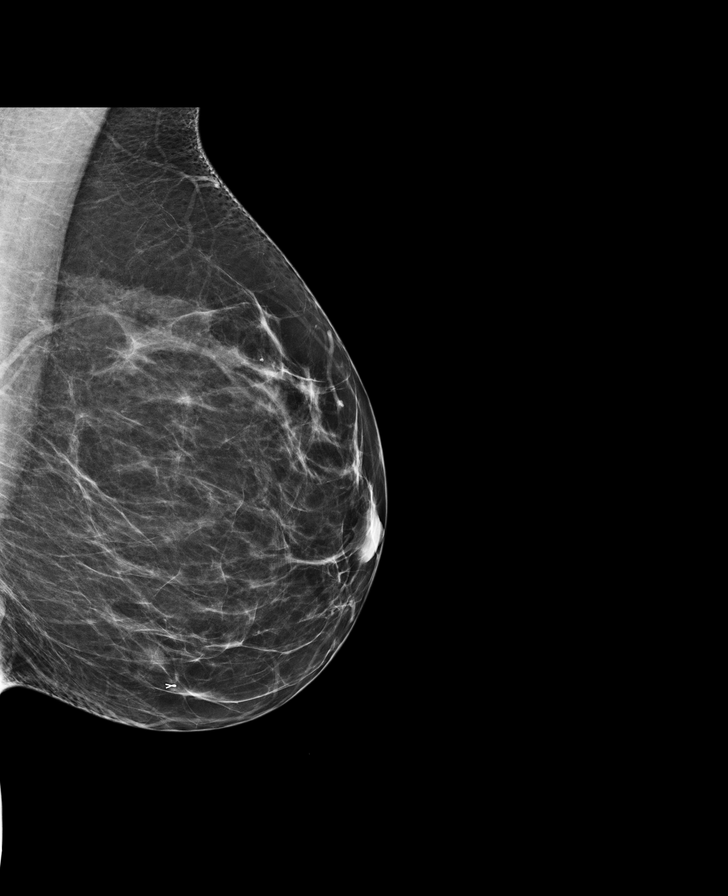

[R CC]
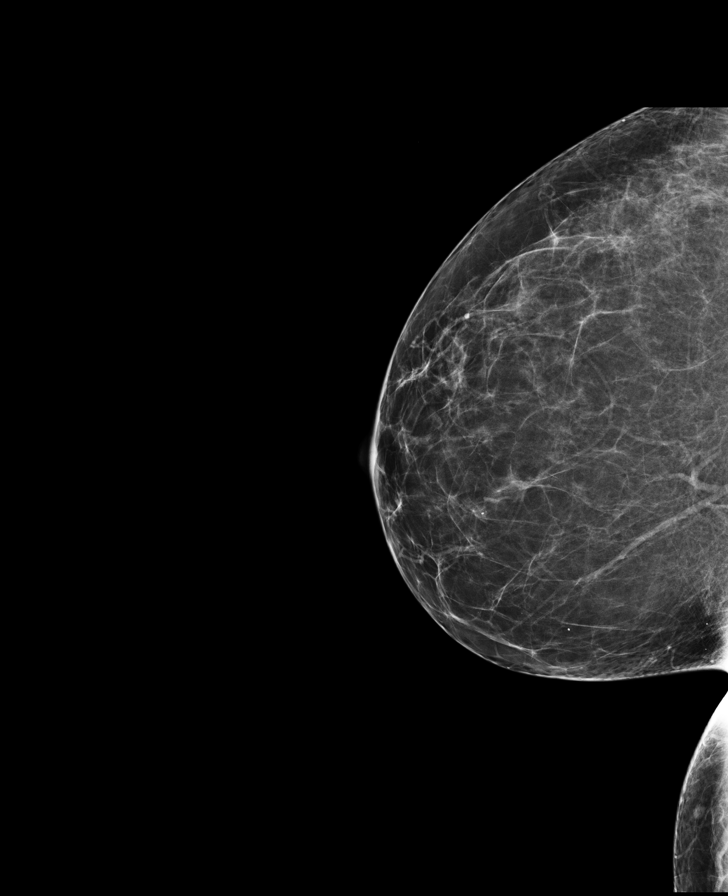

[L MLO synth-2D]
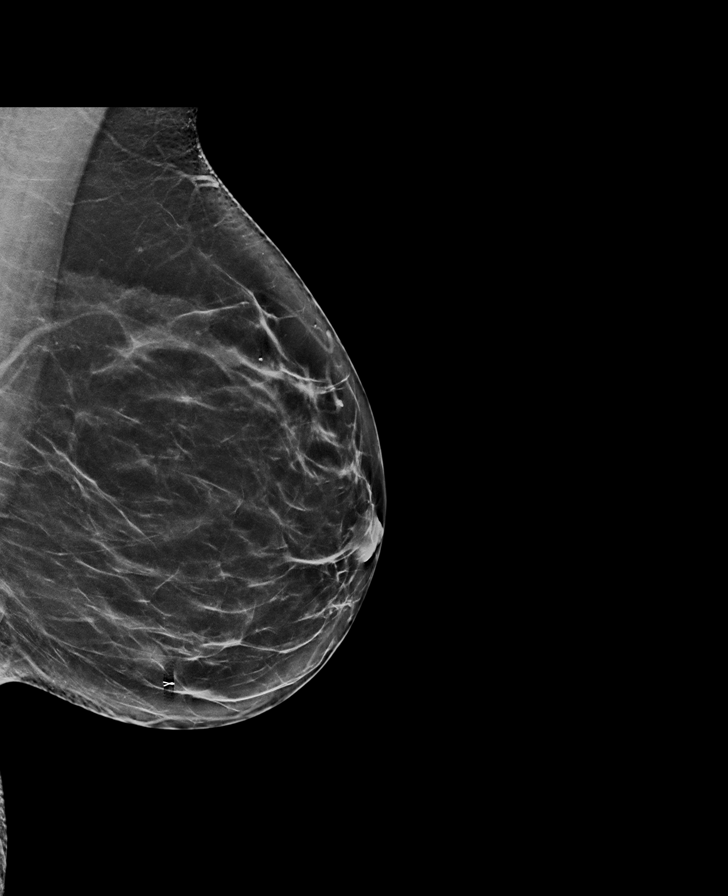

[R MLO synth-2D]
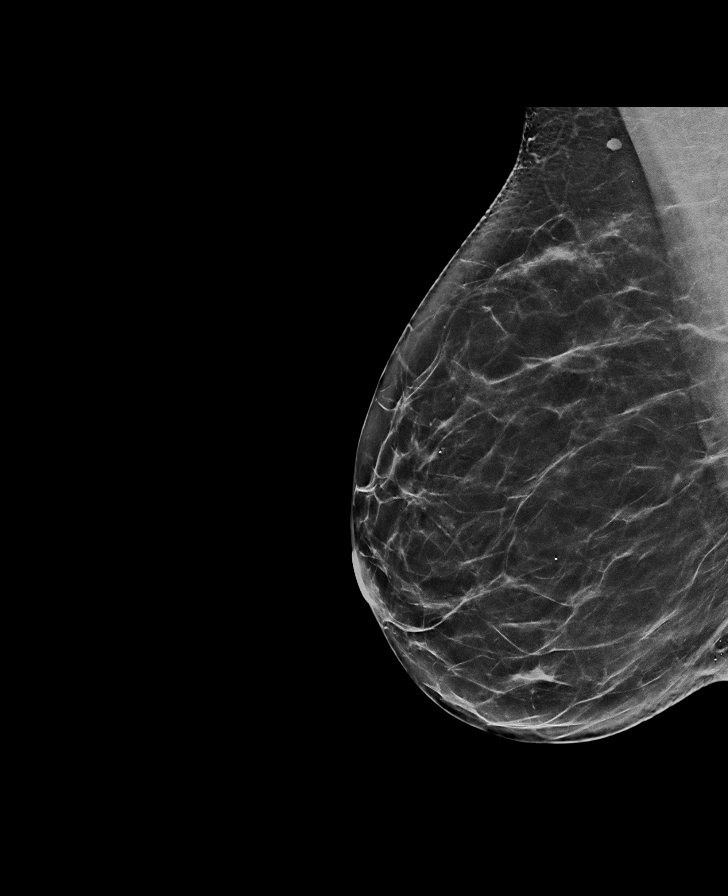

[L CC]
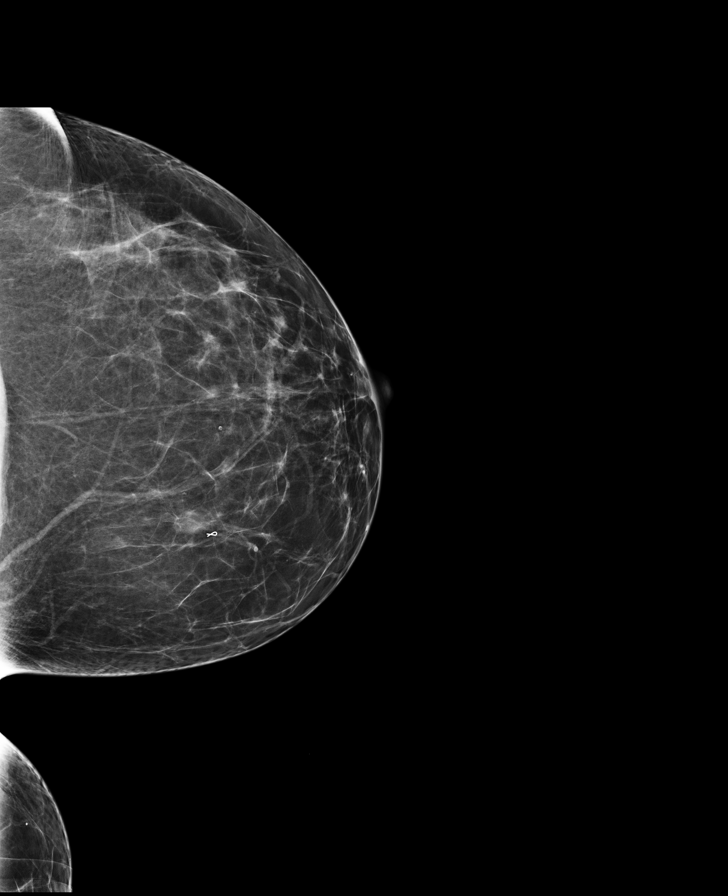

[R CC synth-2D]
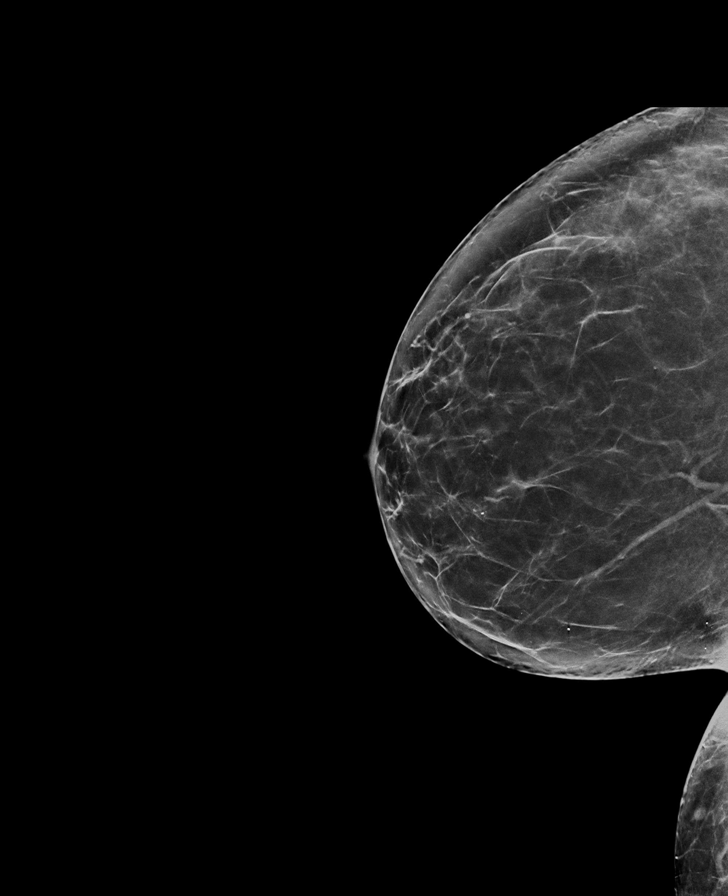

[R MLO]
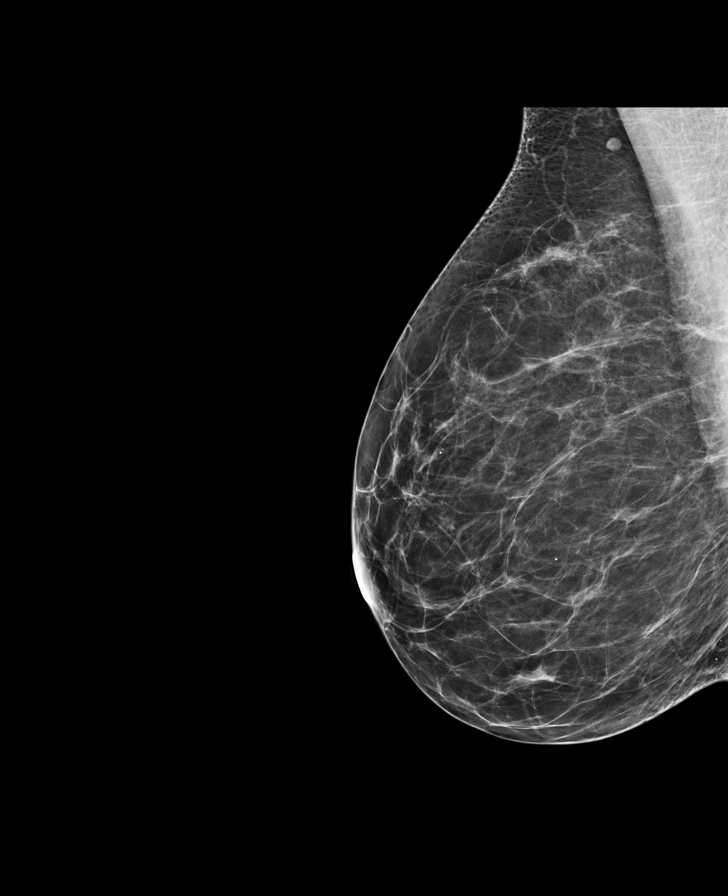

[L CC synth-2D]
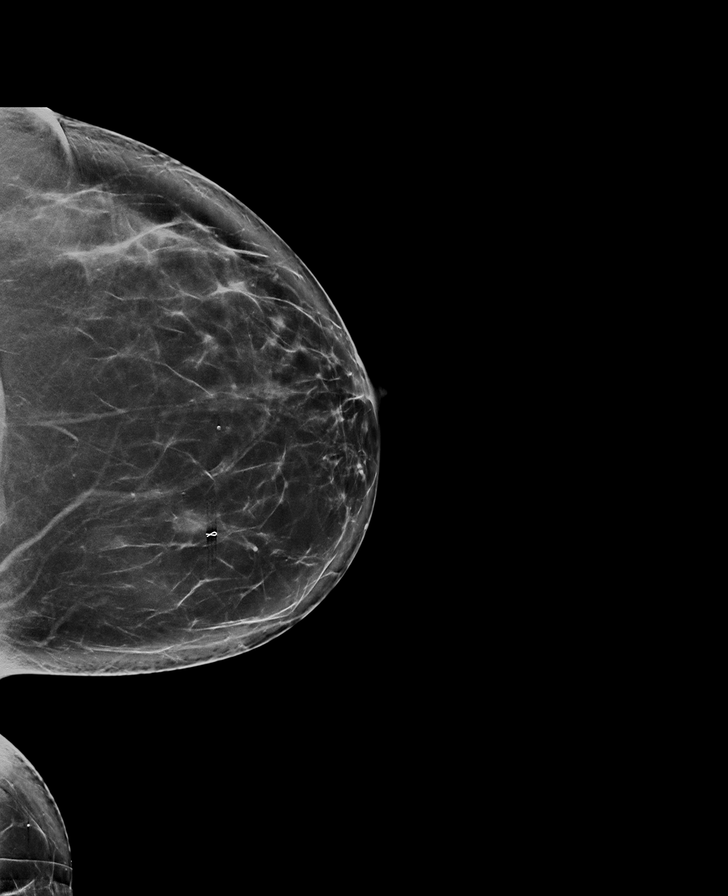

[8 of 28 positions shown; findings below may reference images not displayed]

ACR Breast Density Category b: There are scattered areas of
fibroglandular density.
FINDINGS: Within the lower inner quadrant of the left breast, there is a
circumscribed oval mass, slightly smaller when compared with prior
studies. An adjacent ribbon shaped clip is identified following
previous biopsy. No new mass, distortion, or suspicious
microcalcifications are identified in either breast.

Mammographic images were processed with CAD.
IMPRESSION: 1. Stable appearance of left breast lesion, consistent with benign
fibroadenoma. No specific imaging follow-up is felt to be necessary
for this lesion. Continued attention to this region suggested on
future screening studies.
2.  No mammographic evidence for malignancy.

RECOMMENDATION:
Screening mammogram in one year.(Code:[id number])

I have discussed the findings and recommendations with the patient.
Results were also provided in writing at the conclusion of the
visit. If applicable, a reminder letter will be sent to the patient
regarding the next appointment.

BI-RADS CATEGORY  2: Benign.

## 2018-02-06 ENCOUNTER — Other Ambulatory Visit: Payer: Self-pay | Admitting: Physician Assistant

## 2018-02-06 DIAGNOSIS — Z760 Encounter for issue of repeat prescription: Secondary | ICD-10-CM

## 2018-02-06 DIAGNOSIS — Z8619 Personal history of other infectious and parasitic diseases: Secondary | ICD-10-CM

## 2018-10-26 ENCOUNTER — Encounter: Payer: Self-pay | Admitting: Physician Assistant

## 2018-10-30 ENCOUNTER — Encounter: Payer: Self-pay | Admitting: Physician Assistant

## 2019-01-04 ENCOUNTER — Encounter: Payer: Self-pay | Admitting: Physician Assistant

## 2019-01-04 NOTE — Telephone Encounter (Signed)
Pt is Veronica Wu. Can look back in EMR. [place] for letter.

## 2019-08-16 ENCOUNTER — Encounter: Payer: Self-pay | Admitting: Physician Assistant

## 2019-09-17 ENCOUNTER — Encounter: Payer: Self-pay | Admitting: Physician Assistant

## 2019-09-18 ENCOUNTER — Encounter: Payer: Self-pay | Admitting: Physician Assistant

## 2019-09-18 NOTE — Telephone Encounter (Signed)
To whom it may concern,   The above patient was seen in clinic on 09/16/2019. Changes were made to medication list:  Added ferrous sulfate every morning with breakfast 325mg .  Increased aricept to 10mg  at bedtime.  Added metamucil 2 capsules every morning with breakfast.  Increase imodium as needed twice a day for diarrhea. Added bactroban 2 percent ointment as needed to any cut or abrasion or skin sore.   This is the letter sent to [facility]. Should imodium be BID or QID PRN?

## 2019-09-18 NOTE — Telephone Encounter (Signed)
Please send on paper rx to [facility] immodium QID PRN.

## 2019-09-19 NOTE — Telephone Encounter (Signed)
It's for [facility].

## 2019-09-19 NOTE — Telephone Encounter (Signed)
Please resend patient's referral.

## 2019-09-19 NOTE — Telephone Encounter (Signed)
Ok to resend referral ?

## 2019-09-19 NOTE — Telephone Encounter (Signed)
Looks like they are okay with referral. Let me know what dose of vitamins and we can send the order along with imodium change to [facility].

## 2019-09-20 NOTE — Telephone Encounter (Signed)
Arline Asp is doing referrals today and yesterday so I will print this for her to make sure she sees this

## 2019-09-20 NOTE — Telephone Encounter (Signed)
Resent referral to [facility]

## 2019-09-23 NOTE — Telephone Encounter (Signed)
Ferrous sulfate 325mg  twice a day with meals.  Calicum 600mg  twice a day.

## 2019-09-24 NOTE — Telephone Encounter (Signed)
Letter written for patient and given to triage to fax.

## 2020-02-11 ENCOUNTER — Encounter: Payer: Self-pay | Admitting: Physician Assistant

## 2021-09-17 ENCOUNTER — Other Ambulatory Visit: Payer: Self-pay | Admitting: Obstetrics and Gynecology

## 2021-09-17 DIAGNOSIS — R928 Other abnormal and inconclusive findings on diagnostic imaging of breast: Secondary | ICD-10-CM

## 2021-10-06 ENCOUNTER — Ambulatory Visit: Payer: 59

## 2021-10-06 ENCOUNTER — Other Ambulatory Visit: Payer: Self-pay | Admitting: Obstetrics and Gynecology

## 2021-10-06 ENCOUNTER — Ambulatory Visit
Admission: RE | Admit: 2021-10-06 | Discharge: 2021-10-06 | Disposition: A | Discharge status: Home / Self Care | Payer: BC Managed Care – PPO | Source: Ambulatory Visit | Attending: Obstetrics and Gynecology | Admitting: Obstetrics and Gynecology

## 2021-10-06 DIAGNOSIS — R928 Other abnormal and inconclusive findings on diagnostic imaging of breast: Secondary | ICD-10-CM

## 2023-08-29 ENCOUNTER — Other Ambulatory Visit: Payer: Self-pay | Admitting: Obstetrics and Gynecology

## 2023-08-29 DIAGNOSIS — Z1231 Encounter for screening mammogram for malignant neoplasm of breast: Secondary | ICD-10-CM

## 2023-11-22 ENCOUNTER — Ambulatory Visit
Admission: RE | Admit: 2023-11-22 | Discharge: 2023-11-22 | Disposition: A | Source: Ambulatory Visit | Attending: Obstetrics and Gynecology | Admitting: Obstetrics and Gynecology

## 2023-11-22 DIAGNOSIS — Z1231 Encounter for screening mammogram for malignant neoplasm of breast: Secondary | ICD-10-CM
